# Patient Record
Sex: Female | Born: 1990 | Hispanic: Yes | Marital: Single | Smoking: Never smoker
Health system: Southern US, Community
[De-identification: ages and names within clinical notes are randomized; demographics above are authoritative.]

## PROBLEM LIST (undated history)

## (undated) HISTORY — PX: TONSILLECTOMY: SUR1361

---

## 2017-08-26 DIAGNOSIS — J309 Allergic rhinitis, unspecified: Secondary | ICD-10-CM | POA: Diagnosis not present

## 2017-08-26 DIAGNOSIS — R300 Dysuria: Secondary | ICD-10-CM | POA: Diagnosis not present

## 2017-08-26 DIAGNOSIS — N3000 Acute cystitis without hematuria: Secondary | ICD-10-CM | POA: Diagnosis not present

## 2018-02-06 DIAGNOSIS — R109 Unspecified abdominal pain: Secondary | ICD-10-CM | POA: Diagnosis not present

## 2018-02-06 DIAGNOSIS — K219 Gastro-esophageal reflux disease without esophagitis: Secondary | ICD-10-CM | POA: Diagnosis not present

## 2018-02-06 DIAGNOSIS — K5900 Constipation, unspecified: Secondary | ICD-10-CM | POA: Diagnosis not present

## 2018-05-19 DIAGNOSIS — R300 Dysuria: Secondary | ICD-10-CM | POA: Diagnosis not present

## 2018-05-19 DIAGNOSIS — N390 Urinary tract infection, site not specified: Secondary | ICD-10-CM | POA: Diagnosis not present

## 2019-01-20 ENCOUNTER — Other Ambulatory Visit: Payer: Self-pay

## 2019-01-20 DIAGNOSIS — Z20822 Contact with and (suspected) exposure to covid-19: Secondary | ICD-10-CM

## 2019-01-21 LAB — NOVEL CORONAVIRUS, NAA: SARS-CoV-2, NAA: NOT DETECTED

## 2019-01-22 ENCOUNTER — Telehealth: Payer: Self-pay | Admitting: *Deleted

## 2019-01-22 NOTE — Telephone Encounter (Signed)
Patient's husband called to translate for patient- advised negative COVID result. Patient is not having symptoms and was tested with sick children. Household is negative including children. Patient to follow up with PCP if needed.

## 2019-04-08 ENCOUNTER — Other Ambulatory Visit: Payer: Self-pay

## 2019-04-08 DIAGNOSIS — M25522 Pain in left elbow: Secondary | ICD-10-CM | POA: Diagnosis not present

## 2019-04-08 NOTE — ED Triage Notes (Signed)
Pt states she broke her left elbow years ago and today she was doing push ups when she felt sharp pain to same elbow.

## 2019-04-09 ENCOUNTER — Emergency Department: Payer: Medicaid Other

## 2019-04-09 ENCOUNTER — Emergency Department
Admission: EM | Admit: 2019-04-09 | Discharge: 2019-04-09 | Disposition: A | Discharge status: Home / Self Care | Payer: Medicaid Other | Attending: Emergency Medicine | Admitting: Emergency Medicine

## 2019-04-09 DIAGNOSIS — M25522 Pain in left elbow: Secondary | ICD-10-CM | POA: Diagnosis not present

## 2019-04-09 MED ORDER — IBUPROFEN 800 MG PO TABS
800.0000 mg | ORAL_TABLET | Freq: Once | ORAL | Status: AC
  Administered 2019-04-09: 800 mg via ORAL
  Filled 2019-04-09: qty 1

## 2019-04-09 MED ORDER — HYDROCODONE-ACETAMINOPHEN 5-325 MG PO TABS: 1.0000 | ORAL_TABLET | Freq: Four times a day (QID) | ORAL | 0 refills | Status: DC | PRN

## 2019-04-09 MED ORDER — IBUPROFEN 800 MG PO TABS: 800.0000 mg | ORAL_TABLET | Freq: Three times a day (TID) | ORAL | 0 refills | Status: DC | PRN

## 2019-04-09 MED ORDER — HYDROCODONE-ACETAMINOPHEN 5-325 MG PO TABS
1.0000 | ORAL_TABLET | Freq: Once | ORAL | Status: AC
  Administered 2019-04-09: 1 via ORAL
  Filled 2019-04-09: qty 1

## 2019-04-09 NOTE — ED Provider Notes (Signed)
[facility] Emergency Department Provider Note   ____________________________________________   First MD Initiated Contact with Patient 04/09/19 0205     (approximate)  I have reviewed the triage vital signs and the nursing notes.   HISTORY  Chief Complaint Joint Swelling    HPI Morgan Chen is a 28 y.o. female who presents to the ED from home with a chief complaint of left elbow pain.  Patient reports she broke her elbow 15 years ago; did push-ups yesterday when she felt a sharp pain to her elbow.  Patient is left-hand dominant.  Denies extremity weakness, numbness or tingling. Denies swelling.       Past medical history None  There are no active problems to display for this patient.   Prior to Admission medications   Medication Sig Start Date End Date Taking? Authorizing Provider  HYDROcodone-acetaminophen (NORCO) 5-325 MG tablet Take 1 tablet by mouth every 6 (six) hours as needed for moderate pain. 04/09/19   Irean Hong, MD  ibuprofen (ADVIL) 800 MG tablet Take 1 tablet (800 mg total) by mouth every 8 (eight) hours as needed for moderate pain. 04/09/19   Irean Hong, MD    Allergies Patient has no allergy information on record.  No family history on file.  Social History Social History   Tobacco Use  . Smoking status: Not on file  Substance Use Topics  . Alcohol use: Not on file  . Drug use: Not on file  No recent EtOH use  Review of Systems  Constitutional: No fever/chills Eyes: No visual changes. ENT: No sore throat. Cardiovascular: Denies chest pain. Respiratory: Denies shortness of breath. Gastrointestinal: No abdominal pain.  No nausea, no vomiting.  No diarrhea.  No constipation. Genitourinary: Negative for dysuria. Musculoskeletal: Positive for left elbow pain.  Negative for back pain. Skin: Negative for rash. Neurological: Negative for headaches, focal weakness or numbness.    ____________________________________________   PHYSICAL EXAM:  VITAL SIGNS: ED Triage Vitals [04/08/19 2338]  Enc Vitals Group     BP 131/79     Pulse Rate 76     Resp 20     Temp 98.6 F (37 C)     Temp Source Oral     SpO2 98 %     Weight 190 lb (86.2 kg)     Height      Head Circumference      Peak Flow      Pain Score 6     Pain Loc      Pain Edu?      Excl. in GC?     Constitutional: Alert and oriented. Well appearing and in no acute distress. Eyes: Conjunctivae are normal. PERRL. EOMI. Head: Atraumatic. Nose: No congestion/rhinnorhea. Mouth/Throat: Mucous membranes are moist.  Oropharynx non-erythematous. Neck: No stridor.   Cardiovascular: Normal rate, regular rhythm. Grossly normal heart sounds.  Good peripheral circulation. Respiratory: Normal respiratory effort.  No retractions. Lungs CTAB. Gastrointestinal: Soft and nontender. No distention. No abdominal bruits. No CVA tenderness. Musculoskeletal: Left olecranon with minimal swelling.  Limited range of motion secondary to pain.  5/5 motor strength and sensation.  2+ radial pulses.  Brisk, less than 5-second capillary refill. No lower extremity tenderness nor edema.  No joint effusions. Neurologic:  Normal speech and language. No gross focal neurologic deficits are appreciated. No gait instability. Skin:  Skin is warm, dry and intact. No rash noted. Psychiatric: Mood and affect are normal. Speech and behavior are normal.  ____________________________________________   LABS (all labs ordered are listed, but only abnormal results are displayed)  Labs Reviewed - No data to display ____________________________________________  EKG  None ____________________________________________  RADIOLOGY  ED MD interpretation: No acute osseous injuries  Official radiology report(s): Dg Elbow Complete Left  Result Date: 04/09/2019 CLINICAL DATA:  Elbow pain EXAM: LEFT ELBOW - COMPLETE 3+ VIEW COMPARISON:  None.  FINDINGS: There is no evidence of fracture, dislocation, or joint effusion. There is no evidence of arthropathy or other focal bone abnormality. Soft tissues are unremarkable. IMPRESSION: No acute osseous abnormality. Electronically Signed   By: Prudencio Pair M.D.   On: 04/09/2019 00:42    ____________________________________________   PROCEDURES  Procedure(s) performed (including Critical Care):  Procedures   ____________________________________________   INITIAL IMPRESSION / ASSESSMENT AND PLAN / ED COURSE  As part of my medical decision making, I reviewed the following data within the [place] notes reviewed and incorporated, Radiograph reviewed, Notes from prior ED visits and [place] was evaluated in Emergency Department on 04/09/2019 for the symptoms described in the history of present illness. She was evaluated in the context of the global COVID-19 pandemic, which necessitated consideration that the patient might be at risk for infection with the SARS-CoV-2 virus that causes COVID-19. Institutional protocols and algorithms that pertain to the evaluation of patients at risk for COVID-19 are in a state of rapid change based on information released by regulatory bodies including the CDC and federal and state organizations. These policies and algorithms were followed during the patient's care in the ED.    28 year old female who presents with left elbow pain after doing push-ups.  X-rays demonstrate no acute osseous injuries.  Will place in sling, NSAIDs, analgesia as needed and patient will follow-up with orthopedics.  Strict return precautions given.  Patient verbalizes understanding agrees with plan of care.      ____________________________________________   FINAL CLINICAL IMPRESSION(S) / ED DIAGNOSES  Final diagnoses:  Left elbow pain     ED Discharge Orders         Ordered    ibuprofen (ADVIL)  800 MG tablet  Every 8 hours PRN     04/09/19 0223    HYDROcodone-acetaminophen (NORCO) 5-325 MG tablet  Every 6 hours PRN     04/09/19 0223           Note:  This document was prepared using Dragon voice recognition software and may include unintentional dictation errors.   Paulette Blanch, MD 04/09/19 [phone]

## 2019-04-09 NOTE — Discharge Instructions (Signed)
1.  Wear sling as needed for comfort. 2.  You may take pain medicines as needed (Motrin/Norco #15). 3.  Return to the ER for worsening symptoms, persistent vomiting, difficulty breathing or other concerns

## 2019-10-18 ENCOUNTER — Encounter: Payer: Self-pay | Admitting: Family Medicine

## 2019-10-18 ENCOUNTER — Other Ambulatory Visit: Payer: Self-pay

## 2019-10-18 ENCOUNTER — Ambulatory Visit (LOCAL_COMMUNITY_HEALTH_CENTER): Payer: Medicaid Other | Admitting: Family Medicine

## 2019-10-18 VITALS — BP 111/71 | Ht 65.0 in | Wt 168.8 lb

## 2019-10-18 DIAGNOSIS — R87610 Atypical squamous cells of undetermined significance on cytologic smear of cervix (ASC-US): Secondary | ICD-10-CM | POA: Diagnosis not present

## 2019-10-18 DIAGNOSIS — Z3009 Encounter for other general counseling and advice on contraception: Secondary | ICD-10-CM | POA: Diagnosis not present

## 2019-10-18 DIAGNOSIS — N730 Acute parametritis and pelvic cellulitis: Secondary | ICD-10-CM

## 2019-10-18 DIAGNOSIS — Z113 Encounter for screening for infections with a predominantly sexual mode of transmission: Secondary | ICD-10-CM

## 2019-10-18 LAB — WET PREP FOR TRICH, YEAST, CLUE
Trichomonas Exam: NEGATIVE
Yeast Exam: NEGATIVE

## 2019-10-18 MED ORDER — DOXYCYCLINE HYCLATE 100 MG PO TABS: 100.0000 mg | ORAL_TABLET | Freq: Two times a day (BID) | ORAL | 0 refills | Status: AC

## 2019-10-18 MED ORDER — CEFTRIAXONE SODIUM 500 MG IJ SOLR
500.0000 mg | Freq: Once | INTRAMUSCULAR | Status: AC
  Administered 2019-10-18: 17:00:00 500 mg via INTRAMUSCULAR

## 2019-10-18 MED ORDER — MULTIVITAMINS PO CAPS: 1.0000 | ORAL_CAPSULE | Freq: Every day | ORAL | 0 refills | Status: AC

## 2019-10-18 MED ORDER — METRONIDAZOLE 500 MG PO TABS: 500.0000 mg | ORAL_TABLET | Freq: Two times a day (BID) | ORAL | 0 refills | Status: AC

## 2019-10-18 NOTE — Progress Notes (Addendum)
Here today as a new patient. No PE or Pap Smear records here. Current birth control method is Mirena IUD. Wants STD screening including bloodwork. MVI's accepted. Tawny Hopping, RN

## 2019-10-18 NOTE — Progress Notes (Signed)
Family Planning Visit- Initial Visit  Subjective:  Morgan Chen is a 29 y.o.  No obstetric history on file.  being seen today for an initial well woman visit and to discuss family planning options. Patient reports they do not want a pregnancy in the next year.   Chief Complaint  Patient presents with  . Gynecologic Exam    Pt does not have a problem list on file.   HPI  Patient reports she is here as a new pt. She would like STI screening today, she has been having abdominal pain, excessive discharge and abnormal bleeding (periods 3x/mo) for past 3 months.    No LMP recorded. (Menstrual status: IUD). BCM: mirena, placed Dec 2018 Pt desires EC? n/a  Last pap: >3 yrs  Last breast exam: ? Personal/family hx breast cancer? no  Patient reports 1 partner(s) in last year. Do they desire STI screening (if no, why not)? Yes.  Does the patient desire a pregnancy in the next year? no   29 y.o., Body mass index is 28.09 kg/m. - Is patient eligible for HA1C diabetes screening based on BMI and age >13?  no  Has patient been screened once for HCV in the past?  no  No results found for: HCVAB  Does the patient have current of drug use, have a partner with drug use, and/or has been incarcerated since last result? no If yes-- Screen for HCV through [facility]   Does the patient meet criteria for HBV testing? no  Criteria:  -Household, sexual or needle sharing contact with HBV -History of drug use -HIV positive -Those with known Hep C  See flowsheet for other program required questions.     Health Maintenance Due  Topic Date Due  . HIV Screening  Never done  . PAP-Cervical Cytology Screening  Never done  . PAP SMEAR-Modifier  Never done    Review of Systems  Constitutional: Negative.   HENT: Negative.   Eyes: Negative.   Respiratory: Negative.   Cardiovascular: Negative.   Gastrointestinal: Negative.   Genitourinary: Negative.   Musculoskeletal: Negative.    Skin: Negative.   Neurological: Negative.   Endo/Heme/Allergies: Negative.   Psychiatric/Behavioral: Negative.     The following portions of the patient's history were reviewed and updated as appropriate: allergies, current medications, past family history, past medical history, past social history, past surgical history and problem list. Problem list updated.   See flowsheet for other program required questions.  Objective:   Vitals:   10/18/19 1442  BP: 111/71  Weight: 168 lb 12.8 oz (76.6 kg)  Height: 5\' 5"  (1.651 m)    Physical Exam Vitals and nursing note reviewed.  Constitutional:      General: She is not in acute distress.    Appearance: Normal appearance. She is well-developed and well-groomed. She is not ill-appearing or diaphoretic.  HENT:     Head: Normocephalic and atraumatic.     Mouth/Throat:     Mouth: Mucous membranes are moist.     Pharynx: Oropharynx is clear. No oropharyngeal exudate or posterior oropharyngeal erythema.  Pulmonary:     Effort: Pulmonary effort is normal.  Chest:     Breasts:        Right: Normal. No swelling, bleeding, inverted nipple, mass, nipple discharge, skin change or tenderness.        Left: Normal. No swelling, bleeding, inverted nipple, mass, nipple discharge, skin change or tenderness.  Abdominal:     General: Abdomen  is flat.     Palpations: There is no mass.     Tenderness: There is no abdominal tenderness. There is no rebound.  Genitourinary:    General: Normal vulva.     Exam position: Lithotomy position.     Pubic Area: No rash or pubic lice.      Labia:        Right: No rash or lesion.        Left: No rash or lesion.      Vagina: Vaginal discharge (white, profuse, ph<4.5) present. No erythema, bleeding or lesions.     Cervix: Cervical motion tenderness and discharge present. No friability, lesion or erythema.     Uterus: Normal. Tender.      Adnexa:        Right: Tenderness present. No mass.         Left:  Tenderness present. No mass.       Rectum: Normal.     Comments: +IUD strings visualized Lymphadenopathy:     Head:     Right side of head: No preauricular or posterior auricular adenopathy.     Left side of head: No preauricular or posterior auricular adenopathy.     Cervical: No cervical adenopathy.     Upper Body:     Right upper body: No supraclavicular or axillary adenopathy.     Left upper body: No supraclavicular or axillary adenopathy.     Lower Body: No right inguinal adenopathy. No left inguinal adenopathy.  Skin:    General: Skin is warm and dry.     Findings: No rash.  Neurological:     Mental Status: She is alert and oriented to person, place, and time.       Assessment and Plan:  Morgan Chen is a 29 y.o. female presenting to the [facility] Department for an initial well woman exam/family planning visit.  Contraception counseling: Reviewed all forms of birth control options in the tiered based approach. available including abstinence; over the counter/barrier methods; hormonal contraceptive medication including pill, patch, ring, injection,contraceptive implant, ECP; hormonal and nonhormonal IUDs; permanent sterilization options including vasectomy and the various tubal sterilization modalities. Risks, benefits, and typical effectiveness rates were reviewed.  Questions were answered.  Written information was also given to the patient to review.  Patient has mirena, placed 2018, would like to keep this.  She will follow up in  1 year for surveillance.  She was told to call with any further questions, or with any concerns about this method of contraception.  Emphasized use of condoms 100% of the time for STI prevention.  Emergency Contraception: n/a - mirena in place   1. Family planning services -BCM = mirena, placed 2018 -Pap done today -CBE done today. Recommended screening mammograms beginning at age 64. - IGP, rfx Aptima HPV ASCU  2. Screening  examination for venereal disease -Pt without symptoms. Screenings today as below. Treat wet prep per standing order. -Patient does not meet criteria for HepB, HepC Screening.  -Counseled on warning s/sx and when to seek care. Recommended condom use with all sex and discussed importance of condom use for STI prevention. - WET PREP FOR [place], YEAST, [place] LAB - Syphilis Serology, [facility]  3. PID (acute pelvic inflammatory disease) -Treat for PID today.  -Pt is well appearing, able to come back in 3 days, deemed appropriate for outpatient treatment. RN to obtain pulse, temp. -Advised if  pain or fever develops to go to ER.  -Discussed need for rescreen in 3 months.  -Contact card for partner given by RN. - cefTRIAXone (ROCEPHIN) injection 500 mg - doxycycline (VIBRA-TABS) 100 MG tablet; Take 1 tablet (100 mg total) by mouth 2 (two) times daily for 14 days.  Dispense: 28 tablet; Refill: 0 - metroNIDAZOLE (FLAGYL) 500 MG tablet; Take 1 tablet (500 mg total) by mouth 2 (two) times daily for 14 days.  Dispense: 28 tablet; Refill: 0   Return in about 3 days (around 10/21/2019).  Future Appointments  Date Time Provider Department Center  10/21/2019  3:20 PM [facility] PROVIDER [facility] None    Ann Held, PA-C

## 2019-10-18 NOTE — Progress Notes (Signed)
[employer] results reviewed. Per standing orders no treatment indicated. Patient treated for PID per provider orders. Scheduled for PID recheck appt 10/21/2019 @ 3:20. Instructed to arrive at 3:00 for check in. Tawny Hopping, RN

## 2019-10-21 ENCOUNTER — Ambulatory Visit: Payer: Medicaid Other | Admitting: Family Medicine

## 2019-10-21 ENCOUNTER — Other Ambulatory Visit: Payer: Self-pay

## 2019-10-21 ENCOUNTER — Encounter: Payer: Self-pay | Admitting: Family Medicine

## 2019-10-21 VITALS — BP 107/69 | HR 57 | Temp 98.5°F | Wt 166.6 lb

## 2019-10-21 DIAGNOSIS — Z8742 Personal history of other diseases of the female genital tract: Secondary | ICD-10-CM

## 2019-10-21 DIAGNOSIS — Z113 Encounter for screening for infections with a predominantly sexual mode of transmission: Secondary | ICD-10-CM | POA: Diagnosis not present

## 2019-10-21 NOTE — Progress Notes (Signed)
Patient here for PID recheck. Burt Knack, RN

## 2019-10-21 NOTE — Progress Notes (Signed)
S: Pt states her abdominal pain has decreased since last visit. She is taking medications twice daily as directed.   O: Today's Vitals   10/21/19 1505  BP: 107/69  Pulse: (!) 57  Temp: 98.5 F (36.9 C)  Weight: 166 lb 9.6 oz (75.6 kg)   Body mass index is 27.72 kg/m.  Gen: well appearing, NAD HEENT: no scleral icterus Lung: Normal WOB Abd: nontender Pelvic: bimanual exam w/out CMT or adnexal tenderness or fullness. Ext: well perfused, no edema   A/P: Pt is much improved from prior visit 3 days ago. Advised to finish all antibiotics and RTC in 3 months for rescreen. If pt feels worse I advised to go to ER.

## 2019-10-25 LAB — HPV APTIMA: HPV Aptima: NEGATIVE

## 2019-10-25 LAB — IGP, RFX APTIMA HPV ASCU: PAP Smear Comment: 0

## 2020-01-26 DIAGNOSIS — Z20822 Contact with and (suspected) exposure to covid-19: Secondary | ICD-10-CM | POA: Diagnosis not present

## 2020-01-26 DIAGNOSIS — J069 Acute upper respiratory infection, unspecified: Secondary | ICD-10-CM | POA: Diagnosis not present

## 2020-03-27 DIAGNOSIS — B9689 Other specified bacterial agents as the cause of diseases classified elsewhere: Secondary | ICD-10-CM | POA: Diagnosis not present

## 2020-03-27 DIAGNOSIS — R590 Localized enlarged lymph nodes: Secondary | ICD-10-CM | POA: Diagnosis not present

## 2020-03-27 DIAGNOSIS — R1031 Right lower quadrant pain: Secondary | ICD-10-CM | POA: Diagnosis not present

## 2020-03-27 DIAGNOSIS — Z23 Encounter for immunization: Secondary | ICD-10-CM | POA: Diagnosis not present

## 2020-03-27 DIAGNOSIS — N760 Acute vaginitis: Secondary | ICD-10-CM | POA: Diagnosis not present

## 2020-05-26 DIAGNOSIS — Z30432 Encounter for removal of intrauterine contraceptive device: Secondary | ICD-10-CM | POA: Diagnosis not present

## 2020-08-18 ENCOUNTER — Ambulatory Visit: Payer: Medicaid Other

## 2020-08-18 ENCOUNTER — Other Ambulatory Visit: Payer: Self-pay

## 2020-08-18 ENCOUNTER — Ambulatory Visit (LOCAL_COMMUNITY_HEALTH_CENTER): Payer: Medicaid Other | Admitting: Advanced Practice Midwife

## 2020-08-18 NOTE — Progress Notes (Signed)
Per Epic, client had physical 5/3/202 at [facility] with J. Staples PA-C and PAP done at that time. On 05/26/2020, had IUD removed at [facility] since pregnancy desired. Reports 06/2020 menses and menses from 07/19/2020 - 08/13/2020. Presents to [facility] today for evaluation of 07/2020 menses, new onset of headaches / bilateral lower abdominal pain and low back pain. No vaginal bleeding at this time. Consult with Hazle Coca CNM regarding above. Per Ms. Sciora, client needs evaluation with PCP (has Medicaid). Client counseled regarding need for appt with PCP and understanding verbalized. RN verified with client that had PCP at [facility]. Jossie Ng, RN

## 2020-12-12 DIAGNOSIS — M791 Myalgia, unspecified site: Secondary | ICD-10-CM | POA: Diagnosis not present

## 2020-12-12 DIAGNOSIS — U071 COVID-19: Secondary | ICD-10-CM | POA: Diagnosis not present

## 2020-12-12 DIAGNOSIS — Z20822 Contact with and (suspected) exposure to covid-19: Secondary | ICD-10-CM | POA: Diagnosis not present

## 2020-12-12 DIAGNOSIS — J101 Influenza due to other identified influenza virus with other respiratory manifestations: Secondary | ICD-10-CM | POA: Diagnosis not present

## 2021-01-08 DIAGNOSIS — N921 Excessive and frequent menstruation with irregular cycle: Secondary | ICD-10-CM | POA: Diagnosis not present

## 2021-01-08 DIAGNOSIS — Z13220 Encounter for screening for lipoid disorders: Secondary | ICD-10-CM | POA: Diagnosis not present

## 2021-01-08 DIAGNOSIS — Z1159 Encounter for screening for other viral diseases: Secondary | ICD-10-CM | POA: Diagnosis not present

## 2021-01-09 DIAGNOSIS — Z13220 Encounter for screening for lipoid disorders: Secondary | ICD-10-CM | POA: Diagnosis not present

## 2021-01-09 DIAGNOSIS — N921 Excessive and frequent menstruation with irregular cycle: Secondary | ICD-10-CM | POA: Diagnosis not present

## 2021-01-09 DIAGNOSIS — Z1159 Encounter for screening for other viral diseases: Secondary | ICD-10-CM | POA: Diagnosis not present

## 2021-03-23 DIAGNOSIS — N979 Female infertility, unspecified: Secondary | ICD-10-CM | POA: Diagnosis not present

## 2021-04-09 DIAGNOSIS — Z0000 Encounter for general adult medical examination without abnormal findings: Secondary | ICD-10-CM | POA: Diagnosis not present

## 2021-04-09 DIAGNOSIS — Z3200 Encounter for pregnancy test, result unknown: Secondary | ICD-10-CM | POA: Diagnosis not present

## 2021-04-09 DIAGNOSIS — Z23 Encounter for immunization: Secondary | ICD-10-CM | POA: Diagnosis not present

## 2021-04-09 DIAGNOSIS — Z3201 Encounter for pregnancy test, result positive: Secondary | ICD-10-CM | POA: Diagnosis not present

## 2021-04-09 DIAGNOSIS — Z1389 Encounter for screening for other disorder: Secondary | ICD-10-CM | POA: Diagnosis not present

## 2021-04-15 ENCOUNTER — Emergency Department
Admission: EM | Admit: 2021-04-15 | Discharge: 2021-04-16 | Disposition: A | Discharge status: Home / Self Care | Payer: Medicaid Other | Attending: Emergency Medicine | Admitting: Emergency Medicine

## 2021-04-15 ENCOUNTER — Emergency Department: Payer: Medicaid Other

## 2021-04-15 DIAGNOSIS — O26851 Spotting complicating pregnancy, first trimester: Secondary | ICD-10-CM | POA: Insufficient documentation

## 2021-04-15 DIAGNOSIS — Z3A01 Less than 8 weeks gestation of pregnancy: Secondary | ICD-10-CM | POA: Diagnosis not present

## 2021-04-15 DIAGNOSIS — O4690 Antepartum hemorrhage, unspecified, unspecified trimester: Secondary | ICD-10-CM

## 2021-04-15 DIAGNOSIS — O200 Threatened abortion: Secondary | ICD-10-CM | POA: Insufficient documentation

## 2021-04-15 DIAGNOSIS — N939 Abnormal uterine and vaginal bleeding, unspecified: Secondary | ICD-10-CM | POA: Diagnosis not present

## 2021-04-15 DIAGNOSIS — O209 Hemorrhage in early pregnancy, unspecified: Secondary | ICD-10-CM | POA: Diagnosis not present

## 2021-04-15 DIAGNOSIS — R1084 Generalized abdominal pain: Secondary | ICD-10-CM | POA: Diagnosis not present

## 2021-04-15 NOTE — ED Triage Notes (Signed)
Pt here for dark red vaginal bleeding that started 2 hours ago. Pt has seen OBGYN to confirm pregnancy of 7 weeks. LMP 02/19/2021. Pt has some minor cramping in lower abdomen. Pt describes blood as "sandy". Pt in no acute distress.

## 2021-04-16 ENCOUNTER — Other Ambulatory Visit: Payer: Self-pay

## 2021-04-16 DIAGNOSIS — N939 Abnormal uterine and vaginal bleeding, unspecified: Secondary | ICD-10-CM | POA: Diagnosis not present

## 2021-04-16 DIAGNOSIS — Z3A01 Less than 8 weeks gestation of pregnancy: Secondary | ICD-10-CM | POA: Diagnosis not present

## 2021-04-16 DIAGNOSIS — O209 Hemorrhage in early pregnancy, unspecified: Secondary | ICD-10-CM | POA: Diagnosis not present

## 2021-04-16 LAB — HCG, QUANTITATIVE, PREGNANCY: hCG, Beta Chain, Quant, S: 127821 m[IU]/mL — ABNORMAL HIGH (ref <5)

## 2021-04-16 LAB — URINALYSIS, ROUTINE W REFLEX MICROSCOPIC
Bilirubin Urine: NEGATIVE
Glucose, UA: NEGATIVE mg/dL
Ketones, ur: 20 mg/dL — AB
Leukocytes,Ua: NEGATIVE
Nitrite: NEGATIVE
Protein, ur: 100 mg/dL — AB
Specific Gravity, Urine: 1.024 (ref 1.005–1.030)
pH: 5 (ref 5.0–8.0)

## 2021-04-16 LAB — CBC WITH DIFFERENTIAL/PLATELET
Abs Immature Granulocytes: 0.09 10*3/uL — ABNORMAL HIGH (ref 0.00–0.07)
Basophils Absolute: 0.1 10*3/uL (ref 0.0–0.1)
Basophils Relative: 0 %
Eosinophils Absolute: 0.4 10*3/uL (ref 0.0–0.5)
Eosinophils Relative: 3 %
HCT: 40.5 % (ref 36.0–46.0)
Hemoglobin: 13.8 g/dL (ref 12.0–15.0)
Immature Granulocytes: 1 %
Lymphocytes Relative: 35 %
Lymphs Abs: 5.1 10*3/uL — ABNORMAL HIGH (ref 0.7–4.0)
MCH: 30.7 pg (ref 26.0–34.0)
MCHC: 34.1 g/dL (ref 30.0–36.0)
MCV: 90.2 fL (ref 80.0–100.0)
Monocytes Absolute: 0.9 10*3/uL (ref 0.1–1.0)
Monocytes Relative: 6 %
Neutro Abs: 8.1 10*3/uL — ABNORMAL HIGH (ref 1.7–7.7)
Neutrophils Relative %: 55 %
Platelets: 263 10*3/uL (ref 150–400)
RBC: 4.49 MIL/uL (ref 3.87–5.11)
RDW: 12.7 % (ref 11.5–15.5)
WBC: 14.6 10*3/uL — ABNORMAL HIGH (ref 4.0–10.5)
nRBC: 0 % (ref 0.0–0.2)

## 2021-04-16 LAB — ABO/RH: ABO/RH(D): A POS

## 2021-04-16 NOTE — ED Provider Notes (Signed)
[facility] Emergency Department Provider Note  ____________________________________________   Event Date/Time   First MD Initiated Contact with Patient 04/16/21 [phone]     (approximate)  I have reviewed the triage vital signs and the nursing notes.   HISTORY  Chief Complaint Vaginal Bleeding    HPI Morgan Chen is a 30 y.o. female  who is pregnant with her third child at about [redacted] weeks gestation and presents for evaluation of acute onset vaginal bleeding.  She said it started earlier today and is mostly dark-colored blood when she goes to the bathroom but also she is needing to wear a pad.  No clots.  She is having some generalized abdominal cramping in the front and the back.  No recent trauma.  She denies fever, sore throat, chest pain, shortness of breath, nausea, vomiting.  Nothing particular makes his symptoms better or worse and they are moderate in severity.         No past medical history on file.  There are no problems to display for this patient.   No past surgical history on file.  Prior to Admission medications   Medication Sig Start Date End Date Taking? Authorizing Provider  levonorgestrel (MIRENA) 20 MCG/24HR IUD 1 each by Intrauterine route once. Placed Dec, 2018 per pt    [provider]    Allergies Patient has no known allergies.  Family History  Problem Relation Age of Onset   Diabetes Maternal Grandmother    Diabetes Maternal Aunt    Breast cancer Neg Hx     Social History Social History   Tobacco Use   Smoking status: Never   Smokeless tobacco: Never  Vaping Use   Vaping Use: Never used  Substance Use Topics   Alcohol use: Never   Drug use: Never    Review of Systems Constitutional: No fever/chills Eyes: No visual changes. ENT: No sore throat. Cardiovascular: Denies chest pain. Respiratory: Denies shortness of breath. Gastrointestinal: Mild to moderate abdominal cramping.  No nausea, no  vomiting.  No diarrhea.  No constipation. Genitourinary: Positive for vaginal bleeding in early pregnancy Musculoskeletal: Negative for neck pain.  Mild low back pain Integumentary: Negative for rash. Neurological: Negative for headaches, focal weakness or numbness.   ____________________________________________   PHYSICAL EXAM:  VITAL SIGNS: ED Triage Vitals  Enc Vitals Group     BP 04/15/21 2359 121/69     Pulse Rate 04/15/21 2359 68     Resp 04/15/21 2359 16     Temp 04/15/21 2359 98.6 F (37 C)     Temp Source 04/15/21 2359 Oral     SpO2 04/15/21 2359 100 %     Weight 04/16/21 0000 87.1 kg (192 lb)     Height 04/16/21 0000 1.651 m (5\' 5" )     Head Circumference --      Peak Flow --      Pain Score 04/16/21 0000 5     Pain Loc --      Pain Edu? --      Excl. in GC? --     Constitutional: Alert and oriented.  Eyes: Conjunctivae are normal.  Head: Atraumatic. Nose: No congestion/rhinnorhea. Mouth/Throat: Patient is wearing a mask. Neck: No stridor.  No meningeal signs.   Cardiovascular: Normal rate, regular rhythm. Good peripheral circulation. Respiratory: Normal respiratory effort.  No retractions. Gastrointestinal: Soft and nondistended.  Diffuse tenderness palpation throughout the abdomen with no rebound or guarding. Genitourinary: Deferred Musculoskeletal: No lower extremity  tenderness nor edema. No gross deformities of extremities. Neurologic:  Normal speech and language. No gross focal neurologic deficits are appreciated.  Skin:  Skin is warm, dry and intact. Psychiatric: Mood and affect are normal. Speech and behavior are normal.  ____________________________________________   LABS (all labs ordered are listed, but only abnormal results are displayed)  Labs Reviewed  HCG, QUANTITATIVE, PREGNANCY - Abnormal; Notable for the following components:      Result Value   hCG, Beta Chain, [place] Longest 086,578 (*)    All other components within normal limits  CBC  WITH DIFFERENTIAL/PLATELET - Abnormal; Notable for the following components:   WBC 14.6 (*)    Neutro Abs 8.1 (*)    Lymphs Abs 5.1 (*)    Abs Immature Granulocytes 0.09 (*)    All other components within normal limits  URINALYSIS, ROUTINE W REFLEX MICROSCOPIC - Abnormal; Notable for the following components:   Color, Urine YELLOW (*)    APPearance CLOUDY (*)    Hgb urine dipstick LARGE (*)    Ketones, ur 20 (*)    Protein, ur 100 (*)    All other components within normal limits  URINE CULTURE  ABO/RH   ____________________________________________   RADIOLOGY Marylou Mccoy, personally viewed and evaluated these images (plain radiographs) as part of my medical decision making, as well as reviewing the written report by the radiologist.  ED MD interpretation: 6 weeks and 5 days single intrauterine gestation.  No acute abnormalities noted on ultrasound.  Official radiology report(s): US OB Comp Less 14 Wks  Result Date: 04/16/2021 CLINICAL DATA:  Pregnant, LMP 02/21/2021, vaginal bleeding EXAM: OBSTETRIC <14 WK ULTRASOUND TECHNIQUE: Transabdominal ultrasound was performed for evaluation of the gestation as well as the maternal uterus and adnexal regions. COMPARISON:  None. FINDINGS: Intrauterine gestational sac: Present, single Yolk sac:  Present, single, normal appearing Embryo:  Present, single Cardiac Activity: Present Heart Rate: 136 bpm CRL:   8 mm   6 w 5 d                  [place] EDC: 12/04/2020 Subchorionic hemorrhage:  None visualized. Maternal uterus/adnexae: The uterus is anteverted. No intrauterine masses are seen. The cervix is closed and is unremarkable. No free fluid within the cul-de-sac. The maternal ovaries are unremarkable. IMPRESSION: Single living intrauterine gestation with an estimated gestational age of [redacted] weeks, 5 days. Electronically Signed   By: Helyn Numbers M.D.   On: 04/16/2021 00:46     ____________________________________________   PROCEDURES   Procedure(s) performed (including Critical Care):  Procedures   ____________________________________________   INITIAL IMPRESSION / MDM / ASSESSMENT AND PLAN / ED COURSE  As part of my medical decision making, I reviewed the following data within the electronic MEDICAL RECORD NUMBER Nursing notes reviewed and incorporated, Labs reviewed , Old chart reviewed, and Notes from prior ED visits   Differential diagnosis includes, but is not limited to, threatened miscarriage, incomplete miscarriage, normal bleeding from an early trimester pregnancy, ectopic pregnancy, , blighted ovum, vaginal/cervical trauma, subchorionic hemorrhage/hematoma, etc.  Vital signs stable throughout extended period of time in the emergency department.  Physical exam reassuring.  Cloudy urine but no indication of infection, ordered urine culture to verify.  CBC is essentially normal; mild leukocytosis but normal hemoglobin.  ABO/Rh is A-positive, no need for RhoGAM.  Ultrasound shows single viable intrauterine pregnancy at about estimated age of approximately 7 weeks (6 weeks and 5 days).  I gave the patient my usual  and customary threatened miscarriage discussion and she will call for follow-up at her OB/GYN clinic.  I gave my usual and customary return precautions.           ____________________________________________  FINAL CLINICAL IMPRESSION(S) / ED DIAGNOSES  Final diagnoses:  Vaginal bleeding in pregnancy  Threatened miscarriage     MEDICATIONS GIVEN DURING THIS VISIT:  Medications - No data to display   ED Discharge Orders     None        Note:  This document was prepared using Dragon voice recognition software and may include unintentional dictation errors.   Loleta Rose, MD 04/16/21 [phone]

## 2021-04-17 DIAGNOSIS — Z3A01 Less than 8 weeks gestation of pregnancy: Secondary | ICD-10-CM | POA: Diagnosis not present

## 2021-04-17 DIAGNOSIS — O209 Hemorrhage in early pregnancy, unspecified: Secondary | ICD-10-CM | POA: Diagnosis not present

## 2021-04-17 LAB — URINE CULTURE

## 2021-04-23 DIAGNOSIS — Z0000 Encounter for general adult medical examination without abnormal findings: Secondary | ICD-10-CM | POA: Diagnosis not present

## 2021-04-23 DIAGNOSIS — O200 Threatened abortion: Secondary | ICD-10-CM | POA: Diagnosis not present

## 2021-06-19 ENCOUNTER — Other Ambulatory Visit: Payer: Self-pay | Admitting: Physician Assistant

## 2021-06-19 DIAGNOSIS — Z3482 Encounter for supervision of other normal pregnancy, second trimester: Secondary | ICD-10-CM

## 2021-06-20 ENCOUNTER — Ambulatory Visit
Admission: RE | Admit: 2021-06-20 | Discharge: 2021-06-20 | Disposition: A | Discharge status: Home / Self Care | Payer: Medicaid Other | Source: Ambulatory Visit | Attending: Physician Assistant | Admitting: Physician Assistant

## 2021-06-20 ENCOUNTER — Other Ambulatory Visit: Payer: Self-pay

## 2021-06-20 DIAGNOSIS — Z3482 Encounter for supervision of other normal pregnancy, second trimester: Secondary | ICD-10-CM | POA: Insufficient documentation

## 2021-09-08 ENCOUNTER — Encounter: Payer: Self-pay | Admitting: Emergency Medicine

## 2021-09-08 ENCOUNTER — Other Ambulatory Visit: Payer: Self-pay

## 2021-09-08 ENCOUNTER — Emergency Department
Admission: EM | Admit: 2021-09-08 | Discharge: 2021-09-08 | Disposition: A | Discharge status: Home / Self Care | Payer: Medicaid Other | Attending: Emergency Medicine | Admitting: Emergency Medicine

## 2021-09-08 DIAGNOSIS — J069 Acute upper respiratory infection, unspecified: Secondary | ICD-10-CM | POA: Diagnosis not present

## 2021-09-08 DIAGNOSIS — O26892 Other specified pregnancy related conditions, second trimester: Secondary | ICD-10-CM | POA: Diagnosis present

## 2021-09-08 DIAGNOSIS — Z3A24 24 weeks gestation of pregnancy: Secondary | ICD-10-CM | POA: Diagnosis not present

## 2021-09-08 DIAGNOSIS — Z20822 Contact with and (suspected) exposure to covid-19: Secondary | ICD-10-CM | POA: Diagnosis not present

## 2021-09-08 DIAGNOSIS — O99512 Diseases of the respiratory system complicating pregnancy, second trimester: Secondary | ICD-10-CM | POA: Diagnosis not present

## 2021-09-08 LAB — RESP PANEL BY RT-PCR (FLU A&B, COVID) ARPGX2
Influenza A by PCR: NEGATIVE
Influenza B by PCR: NEGATIVE
SARS Coronavirus 2 by RT PCR: NEGATIVE

## 2021-09-08 MED ORDER — ACETAMINOPHEN 325 MG PO TABS
650.0000 mg | ORAL_TABLET | Freq: Once | ORAL | Status: AC
  Administered 2021-09-08: 650 mg via ORAL
  Filled 2021-09-08: qty 2

## 2021-09-08 NOTE — Discharge Instructions (Signed)
Follow-up with your doctor if any continued problems.  Saline nose spray for congestion. ?Tylenol if needed for fever ?Drink lots of fluids to stay hydrated ? ?

## 2021-09-08 NOTE — ED Notes (Signed)
Pt to ED for fever and sore throat, HA, cough and generalized body aches at [redacted] weeks pregnant approx. FHTs were auscultated in triage. Lungs clear but pt has productive cough complains of nasal and chest congestion. ?

## 2021-09-08 NOTE — ED Triage Notes (Signed)
Pt via POV from home. Pt c/o cough, fever, and generalized body aches since yesterday. Pt has been cough up phlegm. Pt is about [redacted] weeks pregnant. Pt took tylenol last night. Pt is A&OX4 and NAD.  ? ?FHT 170bpm ?

## 2021-09-08 NOTE — ED Provider Notes (Signed)
? ?[facility] ?Provider Note ? ? ? Event Date/Time  ? First MD Initiated Contact with Patient 09/08/21 1103   ?  (approximate) ? ? ?History  ? ?Fever, Generalized Body Aches, and Cough ? ? ?HPI ? ?Morgan Chen is a 31 y.o. female presents to the ED with complaint of cough, congestion and fever since yesterday.  Patient denies any nausea, vomiting or diarrhea.  No else in the family is sick at this time.  Patient currently is [redacted] weeks pregnant and goes to [facility] for her prenatal care. ?  ? ? ?Physical Exam  ? ?Triage Vital Signs: ?ED Triage Vitals  ?Enc Vitals Group  ?   BP 09/08/21 1052 112/72  ?   Pulse Rate 09/08/21 1052 (!) 123  ?   Resp 09/08/21 1052 20  ?   Temp 09/08/21 1052 (!) 100.9 ?F (38.3 ?C)  ?   Temp Source 09/08/21 1052 Oral  ?   SpO2 09/08/21 1052 95 %  ?   Weight 09/08/21 1056 198 lb (89.8 kg)  ?   Height 09/08/21 1054 5\' 5"  (1.651 m)  ?   Head Circumference --   ?   Peak Flow --   ?   Pain Score 09/08/21 1053 10  ?   Pain Loc --   ?   Pain Edu? --   ?   Excl. in [place]? --   ? ? ?Most recent vital signs: ?Vitals:  ? 09/08/21 1052 09/08/21 1316  ?BP: 112/72 112/70  ?Pulse: (!) 123 95  ?Resp: 20 18  ?Temp: (!) 100.9 ?F (38.3 ?C) 98.6 ?F (37 ?C)  ?SpO2: 95% 96%  ? ? ? ?General: Awake, no distress.  ?CV:  Good peripheral perfusion.  Regular rate and rhythm. ?Resp:  Normal effort.  Clear bilaterally. ?Abd:  No distention.  ?Other:  Nasal congestion present. ? ? ?ED Results / Procedures / Treatments  ? ?Labs ?(all labs ordered are listed, but only abnormal results are displayed) ?Labs Reviewed  ?RESP PANEL BY RT-PCR (FLU A&B, COVID) [id number]  ? ? ? ? ?PROCEDURES: ? ?Critical Care performed:  ? ?Procedures ? ? ?MEDICATIONS ORDERED IN ED: ?Medications  ?acetaminophen (TYLENOL) tablet 650 mg (650 mg Oral Given 09/08/21 1059)  ? ? ? ?IMPRESSION / MDM / ASSESSMENT AND PLAN / ED COURSE  ?I reviewed the triage vital signs and the nursing notes. ? ? ?Differential  diagnosis includes, but is not limited to, influenza, COVID, viral upper respiratory infection. ? ?Spanish interpreter per [place] ? ?31 year old female presents to the ED with complaint of nasal congestion, fever and generalized body aches since yesterday.  Patient is approximately [redacted] weeks pregnant and denies any pregnancy complications.  She has had no exposure and no one else in the family is sick.  She was reassured that her COVID and influenza test were negative.  We discussed use and saline nose spray for nasal congestion and increase fluids to stay hydrated.  She is aware that she can only take Tylenol and not NSAIDs while she is pregnant.  If she continues to have any issues she should follow-up with her OB/GYN at [facility]. ? ? ?FINAL CLINICAL IMPRESSION(S) / ED DIAGNOSES  ? ?Final diagnoses:  ?Viral URI with cough  ? ? ? ?Rx / DC Orders  ? ?ED Discharge Orders   ? ? None  ? ?  ? ? ? ?Note:  This document was prepared using Systems analyst and  may include unintentional dictation errors. ?  ?Johnn Hai, PA-C ?09/08/21 1426 ? ?  ?Delman Kitten, MD ?09/08/21 1551 ? ?

## 2022-01-04 ENCOUNTER — Emergency Department
Admission: EM | Admit: 2022-01-04 | Discharge: 2022-01-04 | Disposition: A | Discharge status: Home / Self Care | Payer: Medicaid Other | Attending: Student in an Organized Health Care Education/Training Program | Admitting: Student in an Organized Health Care Education/Training Program

## 2022-01-04 ENCOUNTER — Emergency Department: Payer: Medicaid Other

## 2022-01-04 ENCOUNTER — Other Ambulatory Visit: Payer: Self-pay

## 2022-01-04 DIAGNOSIS — R6883 Chills (without fever): Secondary | ICD-10-CM | POA: Insufficient documentation

## 2022-01-04 DIAGNOSIS — R0789 Other chest pain: Secondary | ICD-10-CM | POA: Diagnosis not present

## 2022-01-04 DIAGNOSIS — R519 Headache, unspecified: Secondary | ICD-10-CM | POA: Diagnosis not present

## 2022-01-04 DIAGNOSIS — R1011 Right upper quadrant pain: Secondary | ICD-10-CM | POA: Diagnosis not present

## 2022-01-04 DIAGNOSIS — D72829 Elevated white blood cell count, unspecified: Secondary | ICD-10-CM | POA: Insufficient documentation

## 2022-01-04 LAB — BASIC METABOLIC PANEL
Anion gap: 8 (ref 5–15)
BUN: 13 mg/dL (ref 6–20)
CO2: 23 mmol/L (ref 22–32)
Calcium: 9.3 mg/dL (ref 8.9–10.3)
Chloride: 104 mmol/L (ref 98–111)
Creatinine, Ser: 0.85 mg/dL (ref 0.44–1.00)
GFR, Estimated: >60 mL/min (ref >60)
Glucose, Bld: 142 mg/dL — ABNORMAL HIGH (ref 70–99)
Potassium: 4.2 mmol/L (ref 3.5–5.1)
Sodium: 135 mmol/L (ref 135–145)

## 2022-01-04 LAB — HEPATIC FUNCTION PANEL
ALT: 22 U/L (ref 0–44)
AST: 29 U/L (ref 15–41)
Albumin: 3.9 g/dL (ref 3.5–5.0)
Alkaline Phosphatase: 95 U/L (ref 38–126)
Bilirubin, Direct: 0.3 mg/dL — ABNORMAL HIGH (ref 0.0–0.2)
Indirect Bilirubin: 0.7 mg/dL (ref 0.3–0.9)
Total Bilirubin: 1 mg/dL (ref 0.3–1.2)
Total Protein: 7.2 g/dL (ref 6.5–8.1)

## 2022-01-04 LAB — URINALYSIS, ROUTINE W REFLEX MICROSCOPIC
Bilirubin Urine: NEGATIVE
Glucose, UA: NEGATIVE mg/dL
Hgb urine dipstick: NEGATIVE
Ketones, ur: NEGATIVE mg/dL
Leukocytes,Ua: NEGATIVE
Nitrite: NEGATIVE
Protein, ur: NEGATIVE mg/dL
Specific Gravity, Urine: 1.011 (ref 1.005–1.030)
pH: 5 (ref 5.0–8.0)

## 2022-01-04 LAB — CBC
HCT: 40.1 % (ref 36.0–46.0)
Hemoglobin: 13.2 g/dL (ref 12.0–15.0)
MCH: 29.2 pg (ref 26.0–34.0)
MCHC: 32.9 g/dL (ref 30.0–36.0)
MCV: 88.7 fL (ref 80.0–100.0)
Platelets: 343 10*3/uL (ref 150–400)
RBC: 4.52 MIL/uL (ref 3.87–5.11)
RDW: 12.9 % (ref 11.5–15.5)
WBC: 12.4 10*3/uL — ABNORMAL HIGH (ref 4.0–10.5)
nRBC: 0 % (ref 0.0–0.2)

## 2022-01-04 LAB — LIPASE, BLOOD: Lipase: 36 U/L (ref 11–51)

## 2022-01-04 LAB — POC URINE PREG, ED: Preg Test, Ur: NEGATIVE

## 2022-01-04 LAB — TROPONIN I (HIGH SENSITIVITY): Troponin I (High Sensitivity): 3 ng/L (ref <18)

## 2022-01-04 MED ORDER — KETOROLAC TROMETHAMINE 30 MG/ML IJ SOLN
30.0000 mg | Freq: Once | INTRAMUSCULAR | Status: AC
  Administered 2022-01-04: 30 mg via INTRAMUSCULAR
  Filled 2022-01-04: qty 1

## 2022-01-04 NOTE — ED Triage Notes (Signed)
Patient c/o chest pain midsternum to right side radiating to shoulder since yesterday. Today c.o headache and blurry vision. Denies N/VD

## 2022-01-04 NOTE — ED Provider Notes (Signed)
[facility] Provider Note    Event Date/Time   First MD Initiated Contact with Patient 01/04/22 2151     (approximate)   History   Chest Pain   HPI  Morgan Chen is a 31 y.o. female presents to the ER for evaluation of 1 day of right-sided chest pain and right upper quadrant discomfort has had some headache and chills.  Never had pain like this before.  No sick contacts at home no dysuria no vaginal discharge no diarrhea.  No history of blood clots not on birth control.     Physical Exam   Triage Vital Signs: ED Triage Vitals  Enc Vitals Group     BP 01/04/22 1746 113/69     Pulse Rate 01/04/22 1746 91     Resp 01/04/22 1746 18     Temp 01/04/22 1746 99.1 F (37.3 C)     Temp Source 01/04/22 1746 Oral     SpO2 01/04/22 1746 97 %     Weight --      Height --      Head Circumference --      Peak Flow --      Pain Score 01/04/22 1752 9     Pain Loc --      Pain Edu? --      Excl. in [place]? --     Most recent vital signs: Vitals:   01/04/22 2153 01/04/22 2351  BP: 98/64 130/88  Pulse: 90 90  Resp: 17 16  Temp: 98.4 F (36.9 C)   SpO2: 95% 96%     Constitutional: Alert  Eyes: Conjunctivae are normal.  Head: Atraumatic. Nose: No congestion/rhinnorhea. Mouth/Throat: Mucous membranes are moist.   Neck: Painless ROM.  Cardiovascular:   Good peripheral circulation. Respiratory: Normal respiratory effort.  No retractions.  Gastrointestinal: Soft with mild ttp in ruq Musculoskeletal:  no deformity Neurologic:  MAE spontaneously. No gross focal neurologic deficits are appreciated.  Skin:  Skin is warm, dry and intact. No rash noted. Psychiatric: Mood and affect are normal. Speech and behavior are normal.    ED Results / Procedures / Treatments   Labs (all labs ordered are listed, but only abnormal results are displayed) Labs Reviewed  BASIC METABOLIC PANEL - Abnormal; Notable for the following components:      Result Value    Glucose, Bld 142 (*)    All other components within normal limits  CBC - Abnormal; Notable for the following components:   WBC 12.4 (*)    All other components within normal limits  HEPATIC FUNCTION PANEL - Abnormal; Notable for the following components:   Bilirubin, Direct 0.3 (*)    All other components within normal limits  URINALYSIS, ROUTINE W REFLEX MICROSCOPIC - Abnormal; Notable for the following components:   Color, Urine STRAW (*)    APPearance CLEAR (*)    All other components within normal limits  LIPASE, BLOOD  POC URINE PREG, ED  TROPONIN I (HIGH SENSITIVITY)     EKG  ED ECG REPORT I, Willy Eddy, the attending physician, personally viewed and interpreted this ECG.   Date: 01/04/2022  EKG Time: 17:51  Rate: 90  Rhythm: sinus  Axis: normal  Intervals:normal  ST&T Change: no stemi, no depressions    RADIOLOGY Please see ED Course for my review and interpretation.  I personally reviewed all radiographic images ordered to evaluate for the above acute complaints and reviewed radiology reports and findings.  These findings  were personally discussed with the patient.  Please see medical record for radiology report.    PROCEDURES:  Critical Care performed:   Procedures   MEDICATIONS ORDERED IN ED: Medications  ketorolac (TORADOL) 30 MG/ML injection 30 mg (30 mg Intramuscular Given 01/04/22 2211)     IMPRESSION / MDM / ASSESSMENT AND PLAN / ED COURSE  I reviewed the triage vital signs and the nursing notes.                              Differential diagnosis includes, but is not limited to, pleurisy, costochondritis, pneumonia, pneumothorax, ACS, biliary pathology, pancreatitis  Patient presented to the ER for evaluation of symptoms as described above.  She nontoxic-appearing in no acute distress.  Is a mild leukocytosis does have mild right upper quadrant abdominal pain.  EKG is nonischemic her troponin is negative.  This not consistent with  cardiac etiology.  She is low risk by Wells criteria and is PERC negative its not consistent with PE.  She not meningitic does not appear septic.  Will provide analgesic check ultrasound and reassess   Clinical Course as of 01/04/22 2354  Fri Jan 04, 2022  2346 Patient reassessed.  She is pain-free.  Tolerating p.o. afebrile she feels well.  Do not believe her symptoms are cardiac in nature.    Will give referral to outpatient work-up given cholelithiasis.  We did discuss strict return precautions.  Patient agreeable to plan. [PR]    Clinical Course User Index [PR] Willy Eddy, MD    FINAL CLINICAL IMPRESSION(S) / ED DIAGNOSES   Final diagnoses:  Atypical chest pain  RUQ abdominal pain     Rx / DC Orders   ED Discharge Orders     None        Note:  This document was prepared using Dragon voice recognition software and may include unintentional dictation errors.    Willy Eddy, MD 01/04/22 [phone]

## 2022-01-15 DIAGNOSIS — K8050 Calculus of bile duct without cholangitis or cholecystitis without obstruction: Secondary | ICD-10-CM

## 2022-01-15 HISTORY — DX: Calculus of bile duct without cholangitis or cholecystitis without obstruction: K80.50

## 2022-01-16 ENCOUNTER — Encounter: Payer: Self-pay | Admitting: Surgery

## 2022-01-16 ENCOUNTER — Telehealth: Payer: Self-pay | Admitting: Surgery

## 2022-01-16 ENCOUNTER — Ambulatory Visit: Payer: Medicaid Other | Admitting: Surgery

## 2022-01-16 VITALS — BP 112/77 | HR 73 | Temp 98.1°F | Ht 64.0 in | Wt 204.8 lb

## 2022-01-16 DIAGNOSIS — K8020 Calculus of gallbladder without cholecystitis without obstruction: Secondary | ICD-10-CM | POA: Diagnosis not present

## 2022-01-16 NOTE — Telephone Encounter (Signed)
Spoke with husband, Lars Mage and patient and they have been advised of Pre-Admission date/time, and Surgery date.  Surgery Date: 01/31/22 Preadmission Testing Date: 01/25/22 (phone 1p-5p)  Patient has been made aware to call [phone], between 1-3:00pm the day before surgery, to find out what time to arrive for surgery.

## 2022-01-16 NOTE — Patient Instructions (Signed)
Our surgery scheduler Britta Mccreedy will call you within 24-48 hours to get you scheduled. If you have not heard from her after 48 hours, please call our office. Have the blue sheet available when she calls to write down important information.  If you have any concerns or questions, please feel free to call our office.  Colecistectoma mnimamente invasiva Minimally Invasive Cholecystectomy Una colecistectoma mnimamente invasiva es una ciruga que se realiza para extirpar la vescula biliar. La vescula biliar es un rgano que tiene forma de pera y se encuentra debajo del hgado, del lado derecho del cuerpo. La vescula biliar almacena bilis, un lquido que ayuda al organismo a digerir las grasas. La colecistectoma se realiza con frecuencia para tratar la inflamacin (irritacin e hinchazn) de la vescula biliar (colecistitis). Por lo general, esta afeccin se debe a una acumulacin de clculos biliares (colelitiasis) en la vescula biliar o al estancamiento del lquido de la vescula biliar a causa de que los clculos biliares se atascan en los conductos (tubos) y obstruyen el paso de la bilis. Esto puede producir inflamacin y Engineer, mining. En los [employer], podr ser [place]. Este procedimiento se realiza a travs de pequeas incisiones en el abdomen, en lugar de una incisin grande. Tambin se denomina "ciruga laparoscpica". Se introduce un endoscopio delgado que tiene Secretary/administrator (laparoscopio) a travs de una incisin. A travs de las otras incisiones, se introducen los instrumentos quirrgicos. En algunos casos, es posible que [place] mnimamente invasiva deba cambiarse a una [place] realizada a travs de una incisin ms grande. Esta se denomina "ciruga abierta". Informe al mdico acerca de lo siguiente: Cualquier alergia que tenga. Todos los [employer] [place], incluidos vitaminas, hierbas, gotas oftlmicas, cremas y [address] de [address]. Problemas previos que  usted o algn miembro de su familia hayan tenido con los anestsicos. Cualquier problema de la sangre que tenga. Cirugas a las que se haya sometido. Cualquier afeccin mdica que tenga. Si est embarazada o podra estarlo. Cules son los riesgos? En general, se trata de un procedimiento seguro. Sin embargo, pueden ocurrir complicaciones, por ejemplo: Infeccin. Sangrado. Reacciones alrgicas a los medicamentos. Daos a las estructuras o los rganos cercanos. Un clculo biliar que queda en el conducto biliar comn. El conducto coldoco transporta la bilis desde la vescula biliar hasta el intestino delgado. Una filtracin de bilis del hgado o del conducto qustico despus de que se extirpa la vescula biliar. Qu ocurre antes del procedimiento? Cundo dejar de comer y beber Siga las instrucciones del mdico con respecto a lo que puede comer y beber antes del procedimiento. Pueden incluir: Ocho horas antes del procedimiento Deje de comer la mayora de los alimentos. No coma carne, alimentos fritos ni alimentos grasos. Consuma solo alimentos livianos, como tostadas o Social worker. Todos los lquidos son aceptables, excepto las bebidas energticas y el alcohol. Seis horas antes del procedimiento Deje de comer. Beba nicamente lquidos transparentes, como agua, jugo de fruta transparente, caf solo, t solo y bebidas deportivas. No consuma bebidas energticas ni alcohol. Dos horas antes del procedimiento Deje de beber todos los lquidos. Es posible que le permitan tomar medicamentos con pequeos sorbos de [place]. Si no sigue las instrucciones del mdico, el procedimiento puede retrasarse o cancelarse. Medicamentos Consulte al mdico si debe hacer o no lo siguiente: Multimedia programmer o suspender los medicamentos que [place] habitualmente. Esto es muy importante si toma medicamentos para la diabetes o anticoagulantes. Tomar medicamentos como aspirina e ibuprofeno. Estos medicamentos pueden tener un  efecto anticoagulante en la sangre. No tome estos medicamentos a menos que el mdico se lo indique. Usar medicamentos de venta libre, vitaminas, hierbas y suplementos. Instrucciones generales Si va a marcharse a su casa inmediatamente despus del procedimiento, pdale a un adulto responsable que: Lo lleve a su casa desde el hospital o la clnica. No se le permitir conducir. Lo cuide durante el [employer] indiquen. No consuma ningn producto que contenga nicotina ni tabaco durante al [employer] las 4 semanas anteriores al procedimiento. Estos productos incluyen cigarrillos, tabaco para Theatre manager y aparatos de vapeo, como los Administrator, Civil Service. Si necesita ayuda para dejar de fumar, consulte al [employer]. Pregntele al mdico: Cmo se Forensic psychologist de la Leisure centre manager. Qu medidas se tomarn para evitar una infeccin. Pueden incluir: Rasurar el vello del lugar de la ciruga. Lavar la piel con un jabn antisptico. Recibir antibiticos. Qu ocurre durante el procedimiento?  Le colocarn una va intravenosa (i.v.) en una vena. Le administrarn uno de los siguientes medicamentos o ambos: Un medicamento para ayudar a Lexicographer (sedante). Un medicamento que lo har dormir (anestesia general). Su cirujano le har varias incisiones pequeas en el abdomen. El laparoscopio se introducir a travs de una de las pequeas incisiones. La cmara del laparoscopio enviar imgenes a un monitor que se encuentra en el quirfano. Esto permitir a su Pensions consultant del abdomen. Le inyectarn un gas en el abdomen. Esto expandir el abdomen para que el cirujano tenga ms lugar para Facilities manager. El resto del instrumental necesario para el procedimiento se introducir a travs de las otras incisiones. Se extirpar la vescula biliar a travs de una de las incisiones. Se puede examinar el conducto coldoco. Si se encuentran clculos en la va biliar, tal vez deban extirparse. Despus de la extirpacin de la  vescula biliar, se cerrarn las incisiones con puntos (suturas), grapas o goma para cerrar la piel. Las incisiones pueden cubrirse con una venda (vendaje). El procedimiento puede variar segn el mdico y el hospital. Ladell Heads ocurre despus del procedimiento? Le controlarn la presin arterial, la frecuencia cardaca, la frecuencia respiratoria y Air cabin crew de oxgeno en la sangre hasta que le den el alta del hospital o la clnica. Le darn analgsicos para Human resources officer, si es necesario. Es posible que le coloquen un drenaje en la incisin. Se lo retirarn [employer] despus del procedimiento. Resumen La colecistectoma mnimamente invasiva, tambin llamada colecistectoma laparoscpica, es una ciruga que se realiza para extirpar la vescula biliar a travs de pequeas incisiones. Informe a su mdico sobre todas las otras afecciones que tenga y [place] todos los medicamentos que est usando para dichas afecciones. Antes del procedimiento, siga las instrucciones sobre cundo dejar de comer y beber y Tacy Dura cambiar o suspender medicamentos. Haga que un adulto responsable lo cuide durante el tiempo que le indiquen despus de que le den el alta del hospital o de la clnica. Esta informacin no tiene Theme park manager el consejo del mdico. Asegrese de hacerle al mdico cualquier pregunta que tenga. Document Revised: 12/20/2020 Document Reviewed: 12/20/2020 Elsevier Patient Education  2023 [employer].

## 2022-01-18 NOTE — Progress Notes (Signed)
Patient ID: Morgan Chen, female   DOB: 08-Jan-1991, 31 y.o.   MRN: [id number]  HPI Morgan Chen is a 31 y.o. female   HPI  History reviewed. No pertinent past medical history.  Seen in consultation at the request of Dr. Roxan Hockey for biliary colic versus chronic cholecystitis.  She reports few week history approximately 3-4 of right-sided abdominal pain.  Also reports some epigastric pain.  Pain is sharp moderate intensity worsening with certain meals.  She did came to the emergency room couple weeks ago due to the severity of the pain.  She denies any fevers or chills.  No evidence of bleeding or obstruction.  She did have appropriate work-up in the ER and ultrasound of the right upper quadrant that I have personally reviewed showing evidence of cholelithiasis normal common bile duct.  CBC and CMP were completely normal She is able to perform more than 4 METS of activity without any shortness of breath or chest pain.  She did have a recent C-section 32 months ago.  She has recovered well    Past Surgical History:  Procedure Laterality Date   CESAREAN SECTION  11/13/2021    Family History  Problem Relation Age of Onset   Diabetes Maternal Grandmother    Diabetes Maternal Aunt    Breast cancer Neg Hx     Social History Social History   Tobacco Use   Smoking status: Never   Smokeless tobacco: Never  Vaping Use   Vaping Use: Never used  Substance Use Topics   Alcohol use: Never   Drug use: Never    No Known Allergies  No current outpatient medications on file.   No current facility-administered medications for this visit.     Review of Systems Full ROS  was asked and was negative except for the information on the HPI  Physical Exam Blood pressure 112/77, pulse 73, temperature 98.1 F (36.7 C), temperature source Oral, height 5\' 4"  (1.626 m), weight 204 lb 12.8 oz (92.9 kg), SpO2 98 %. CONSTITUTIONAL: NAD. EYES: Pupils are equal, round, , Sclera are  non-icteric. EARS, NOSE, MOUTH AND THROAT: The oral mucosa is pink and moist. Hearing is intact to voice. LYMPH NODES:  Lymph nodes in the neck are normal. RESPIRATORY:  Lungs are clear. There is normal respiratory effort, with equal breath sounds bilaterally, and without pathologic use of accessory muscles. CARDIOVASCULAR: Heart is regular without murmurs, gallops, or rubs. GI: The abdomen is  soft, nontender, and nondistended. There are no palpable masses. There is no hepatosplenomegaly. There are normal bowel sounds.  C-section scar there is no deep infection GU: Rectal deferred.   MUSCULOSKELETAL: Normal muscle strength and tone. No cyanosis or edema.   SKIN: Turgor is good and there are no pathologic skin lesions or ulcers. NEUROLOGIC: Motor and sensation is grossly normal. Cranial nerves are grossly intact. PSYCH:  Oriented to person, place and time. Affect is normal.  Data Reviewed  I have personally reviewed the patient's imaging, laboratory findings and medical records.    Assessment/Plan   31 year old female with biliary colic/chronic cholecystitis. I Discussed with the patient in detail about her disease process.  I do definitely recommend cholecystectomy in the timely fashion.  I do think she will be a good candidate for robotic approach. I discussed the procedure in detail.  The patient was given 38.  We discussed the risks and benefits of a laparoscopic cholecystectomy and possible cholangiogram including, but not limited to bleeding, infection,  injury to surrounding structures such as the intestine or liver, bile leak, retained gallstones, need to convert to an open procedure, prolonged diarrhea, blood clots such as  DVT, common bile duct injury, anesthesia risks, and possible need for additional procedures.  The likelihood of improvement in symptoms and return to the patient's normal status is good. We discussed the typical post-operative recovery course.  I spent  greater than 60 minutes in this encounter including personally reviewing imaging studies, coordinating her care, placing orders counseling the patient and performing appropriate documentation  [name], MD FACS General Surgeon 01/18/2022, 2:56 PM    

## 2022-01-18 NOTE — H&P (View-Only) (Signed)
Patient ID: Morgan Chen, female   DOB: 08-Jan-1991, 32 y.o.   MRN: [id number]  HPI Morgan Chen is a 30 y.o. female   HPI  History reviewed. No pertinent past medical history.  Seen in consultation at the request of Dr. Roxan Hockey for biliary colic versus chronic cholecystitis.  She reports few week history approximately 3-4 of right-sided abdominal pain.  Also reports some epigastric pain.  Pain is sharp moderate intensity worsening with certain meals.  She did came to the emergency room couple weeks ago due to the severity of the pain.  She denies any fevers or chills.  No evidence of bleeding or obstruction.  She did have appropriate work-up in the ER and ultrasound of the right upper quadrant that I have personally reviewed showing evidence of cholelithiasis normal common bile duct.  CBC and CMP were completely normal She is able to perform more than 4 METS of activity without any shortness of breath or chest pain.  She did have a recent C-section 2 months ago.  She has recovered well    Past Surgical History:  Procedure Laterality Date   CESAREAN SECTION  11/13/2021    Family History  Problem Relation Age of Onset   Diabetes Maternal Grandmother    Diabetes Maternal Aunt    Breast cancer Neg Hx     Social History Social History   Tobacco Use   Smoking status: Never   Smokeless tobacco: Never  Vaping Use   Vaping Use: Never used  Substance Use Topics   Alcohol use: Never   Drug use: Never    No Known Allergies  No current outpatient medications on file.   No current facility-administered medications for this visit.     Review of Systems Full ROS  was asked and was negative except for the information on the HPI  Physical Exam Blood pressure 112/77, pulse 73, temperature 98.1 F (36.7 C), temperature source Oral, height 5\' 4"  (1.626 m), weight 204 lb 12.8 oz (92.9 kg), SpO2 98 %. CONSTITUTIONAL: NAD. EYES: Pupils are equal, round, , Sclera are  non-icteric. EARS, NOSE, MOUTH AND THROAT: The oral mucosa is pink and moist. Hearing is intact to voice. LYMPH NODES:  Lymph nodes in the neck are normal. RESPIRATORY:  Lungs are clear. There is normal respiratory effort, with equal breath sounds bilaterally, and without pathologic use of accessory muscles. CARDIOVASCULAR: Heart is regular without murmurs, gallops, or rubs. GI: The abdomen is  soft, nontender, and nondistended. There are no palpable masses. There is no hepatosplenomegaly. There are normal bowel sounds.  C-section scar there is no deep infection GU: Rectal deferred.   MUSCULOSKELETAL: Normal muscle strength and tone. No cyanosis or edema.   SKIN: Turgor is good and there are no pathologic skin lesions or ulcers. NEUROLOGIC: Motor and sensation is grossly normal. Cranial nerves are grossly intact. PSYCH:  Oriented to person, place and time. Affect is normal.  Data Reviewed  I have personally reviewed the patient's imaging, laboratory findings and medical records.    Assessment/Plan   31 year old female with biliary colic/chronic cholecystitis. I Discussed with the patient in detail about her disease process.  I do definitely recommend cholecystectomy in the timely fashion.  I do think she will be a good candidate for robotic approach. I discussed the procedure in detail.  The patient was given 38.  We discussed the risks and benefits of a laparoscopic cholecystectomy and possible cholangiogram including, but not limited to bleeding, infection,  injury to surrounding structures such as the intestine or liver, bile leak, retained gallstones, need to convert to an open procedure, prolonged diarrhea, blood clots such as  DVT, common bile duct injury, anesthesia risks, and possible need for additional procedures.  The likelihood of improvement in symptoms and return to the patient's normal status is good. We discussed the typical post-operative recovery course.  I spent  greater than 60 minutes in this encounter including personally reviewing imaging studies, coordinating her care, placing orders counseling the patient and performing appropriate documentation  Sterling Big, MD FACS General Surgeon 01/18/2022, 2:56 PM

## 2022-01-25 ENCOUNTER — Encounter
Admission: RE | Admit: 2022-01-25 | Discharge: 2022-01-25 | Disposition: A | Discharge status: Home / Self Care | Payer: Medicaid Other | Source: Ambulatory Visit | Attending: Surgery | Admitting: Surgery

## 2022-01-25 VITALS — Ht 64.0 in | Wt 204.0 lb

## 2022-01-25 DIAGNOSIS — Z01812 Encounter for preprocedural laboratory examination: Secondary | ICD-10-CM

## 2022-01-25 NOTE — Patient Instructions (Addendum)
Your procedure is scheduled on: Thursday, August 17 Report to the Registration Desk on the 1st floor of the [facility] To find out your arrival time, please call [phone] between 1PM - 3PM on: Wednesday, August 16 If your arrival time is 6:00 am, do not arrive prior to that time as the [facility] entrance doors do not open until 6:00 am.  REMEMBER: Instructions that are not followed completely may result in serious medical risk, up to and including death; or upon the discretion of your surgeon and anesthesiologist your surgery may need to be rescheduled.  Do not eat food after midnight the night before surgery.  No gum chewing, lozengers or hard candies.  You may however, drink CLEAR liquids up to 2 hours before you are scheduled to arrive for your surgery. Do not drink anything within 2 hours of your scheduled arrival time.  Clear liquids include: - water  - apple juice without pulp - gatorade (not RED colors) - black coffee or tea (Do NOT add milk or creamers to the coffee or tea) Do NOT drink anything that is not on this list.  DO NOT TAKE ANY MEDICATIONS THE MORNING OF SURGERY   One week prior to surgery: starting today, August 11 Stop Anti-inflammatories (NSAIDS) such as Advil, Aleve, Ibuprofen, Motrin, Naproxen, Naprosyn and Aspirin based products such as Excedrin, Goodys Powder, BC Powder. Stop ANY OVER THE COUNTER supplements until after surgery. You may however, continue to take Tylenol if needed for pain up until the day of surgery.  No Alcohol for 24 hours before or after surgery.  No Smoking including e-cigarettes for 24 hours prior to surgery.  No chewable tobacco products for at least 6 hours prior to surgery.  No nicotine patches on the day of surgery.  Do not use any "recreational" drugs for at least a week prior to your surgery.  Please be advised that the combination of cocaine and anesthesia may have negative outcomes, up to and including death. If you  test positive for cocaine, your surgery will be cancelled.  On the morning of surgery brush your teeth with toothpaste and water, you may rinse your mouth with mouthwash if you wish. Do not swallow any toothpaste or mouthwash.  Use CHG wipes as directed on instruction sheet.  Do not wear jewelry, make-up, hairpins, clips or nail polish.  Do not wear lotions, powders, or perfumes.   Do not shave body from the neck down 48 hours prior to surgery just in case you cut yourself which could leave a site for infection.  Also, freshly shaved skin may become irritated if using the CHG soap.  Contact lenses, hearing aids and dentures may not be worn into surgery.  Do not bring valuables to the hospital. [facility] is not responsible for any missing/lost belongings or valuables.   Notify your doctor if there is any change in your medical condition (cold, fever, infection).  Wear comfortable clothing (specific to your surgery type) to the hospital.  After surgery, you can help prevent lung complications by doing breathing exercises.  Take deep breaths and cough every 1-2 hours. Your doctor may order a device called an Incentive Spirometer to help you take deep breaths. When coughing or sneezing, hold a pillow firmly against your incision with both hands. This is called "splinting." Doing this helps protect your incision. It also decreases belly discomfort.  If you are being discharged the day of surgery, you will not be allowed to drive home. You  will need a responsible adult (18 years or older) to drive you home and stay with you that night.   If you are taking public transportation, you will need to have a responsible adult (18 years or older) with you. Please confirm with your physician that it is acceptable to use public transportation.   Please call the Pre-admissions Testing Dept. at [phone] if you have any questions about these instructions.  Surgery Visitation  Policy:  Patients undergoing a surgery or procedure may have two family members or support persons with them as long as the person is not COVID-19 positive or experiencing its symptoms.   Preparing the Skin Before Surgery     To help prevent the risk of infection at your surgical site, we are now providing you with rinse-free Sage 2% Chlorhexidine Gluconate (CHG) disposable wipes.  The night before surgery: Shower or bathe with warm water. Do not apply perfume, lotions, powders. Wait one hour after shower. Skin should be dry and cool. Open Sage wipe package - 6 disposable cloths are inside. Wipe body using one cloth for the right arm, one cloth for the left arm, one cloth for the right leg, one cloth for the left leg, one cloth for the chest/abdomen area (do not use on breasts if breast feeding), and one cloth for the back. 5. Do not rinse, allow to dry. 6. Skin may fee "tacky" for several minutes. 7. Dress in clean clothes. 8. Place clean sheets on your bed and do not sleep with pets.  REPEAT ABOVE ON THE MORNING OF SURGERY PRIOR TO ARRIVING TO THE HOSPITAL.    Su procedimiento est programado para: jueves 17 de [place] de Chartered certified accountant piso del [facility] Para averiguar su hora de llegada, llame al [phone] entre la 1 p. m. y las 3 p. m. el: mircoles 16 de agosto Si su hora de llegada es a las 6:00 a. m., no llegue antes de esa hora ya que las puertas de [place] del [facility] no se abren hasta las 6:00 a. m.  RECORDAR: Las instrucciones que no se siguen por Airline pilot en un riesgo mdico grave, que puede incluir la [place]; o segn el criterio de su cirujano y Scientific laboratory technician, es posible que sea Aeronautical engineer su Leisure centre manager.  No coma alimentos despus de la medianoche de la noche anterior a la ciruga. No masticar chicle, pastillas o caramelos duros.  Sin embargo, puede beber lquidos [employer] 2 horas antes de la  fecha prevista para la [place]. No beba nada dentro de las 2 horas de su hora de llegada programada.  Los lquidos claros incluyen: - agua - jugo de manzana sin pulpa - gatorade (no colores ROJOS) - caf o t solo (NO agregue leche o cremas al caf o t) NO beba nada que no est en esta lista.  NO TOME NINGN MEDICAMENTO LA MAANA DE LA CIRUGA  Una semana antes de la ciruga: a partir de [place], 11 de agosto Detener los antiinflamatorios (AINE) como Advil, Aleve, Ibuprofen, Motrin, Naproxen, Naprosyn y productos a base de aspirina como Excedrin, Goodys Powder, [employer]. Suspenda CUALQUIER suplemento de venta libre hasta despus de la [place]. Sin embargo, puede Educational psychologist tomando Tylenol si es necesario para Marketing executive de la [place].  No alcohol por 24 horas antes o despus de la [place].  No fumar, incluidos los cigarrillos electrnicos, durante las 24 horas previas a la [place]. No productos  de tabaco masticables durante al menos 6 horas antes de la [place]. Sin parches de Optometrist de la [place].  No use ningn medicamento "recreativo" durante al menos una semana antes de su [place]. Tenga en cuenta que la combinacin de cocana y anestesia puede [address], incluso la [place]. Si la prueba de Musician positivo, se cancelar la ciruga.  En la maana de la ciruga cepllese los dientes con pasta dental y agua, [place] enjuagarse la boca con enjuague bucal si lo desea. No trague ninguna pasta de dientes o enjuague bucal.  Use toallitas CHG como se indica en la hoja de instrucciones.  No use joyas, maquillaje, horquillas para el cabello, clips o esmalte de uas.  No use lociones, talcos ni perfumes.  No se afeite el cuerpo desde el cuello hacia abajo 48 horas antes de la ciruga en caso de que se corte, lo que podra dejar un sitio para la infeccin. Adems, la piel recin afeitada puede irritarse si se [place] el jabn CHG.  No se pueden usar lentes  de contacto, audfonos ni dentaduras postizas en la ciruga.  No lleve objetos de valor al hospital. [facility] no se responsabiliza por pertenencias u objetos de valor extraviados o perdidos.  Informe a su mdico si hay algn cambio en su condicin mdica (resfriado, fiebre, infeccin).  Use ropa cmoda (especfica para su tipo de [place]) al hospital.  Despus de la ciruga, puede ayudar a prevenir complicaciones pulmonares haciendo ejercicios de respiracin. Tome respiraciones profundas y tosa cada 1-2 horas. Su mdico puede ordenar un dispositivo llamado espirmetro de incentivo para ayudarlo a respirar profundamente. Al toser o estornudar, sostenga una almohada firmemente contra la incisin con ambas manos. Esto se llama "entablillado". Hacer esto ayuda a proteger su incisin. Tambin disminuye las molestias abdominales.  Si le dan de alta el da de la [place], no se le permitir conducir hasta su casa. Necesitar un adulto responsable (mayor de 18 aos) que lo lleve a su casa y se quede con usted esa noche.  Si viaja en transporte pblico, deber ir acompaado de un adulto responsable (mayor de 18 aos). Confirme con su mdico que es aceptable usar el transporte pblico.  Llame al [place] de [place]mis[place] al [phone] si tiene alguna pregunta sobre estas instrucciones.  Poltica de visitas a la ciruga:  Los [employer] se someten a [place] o procedimiento pueden [employer] familiares o personas de apoyo con ellos, siempre que la persona no sea COVID-19 positiva o experimente sus sntomas.  Preparacin de la piel antes de la ciruga  Para ayudar a prevenir el riesgo de infeccin en el sitio quirrgico, ahora le proporcionamos toallitas desechables Sage con 2 % de gluconato de clorhexidina (CHG) sin enjuague.  La noche antes de la ciruga: 1. Dchese o bese con agua tibia. 2. No aplique perfume, lociones, talcos. 3. Espere una hora despus de la ducha.  La piel debe estar seca y fresca. 4. Abra el paquete de toallitas Sage: dentro hay 6 toallitas desechables. a. Limpie el cuerpo con un pao para el brazo derecho, un pao para el brazo izquierdo, un pao para la pierna derecha, un pao para la pierna izquierda, un pao para el rea del pecho/abdomen (no lo use en los senos si est amamantando), y Neomia Dear tela para la espalda. 5. No enjuagar, dejar secar. 6. La piel puede sentirse "pegajosa" durante varios minutos. 7. Vstase con ropa limpia. 8. Coloque sbanas limpias en su cama y  no duerma con mascotas.  REPITA LO ANTERIOR EN LA MAANA DE LA CIRUGA ANTES DE [facility]ITAL

## 2022-01-30 MED ORDER — CEFAZOLIN SODIUM-DEXTROSE 2-4 GM/100ML-% IV SOLN
2.0000 g | INTRAVENOUS | Status: AC
  Administered 2022-01-31: 2 g via INTRAVENOUS

## 2022-01-30 MED ORDER — FAMOTIDINE 20 MG PO TABS: 20.0000 mg | ORAL_TABLET | Freq: Once | ORAL | Status: AC

## 2022-01-30 MED ORDER — CHLORHEXIDINE GLUCONATE 0.12 % MT SOLN: 15.0000 mL | Freq: Once | OROMUCOSAL | Status: AC

## 2022-01-30 MED ORDER — INDOCYANINE GREEN 25 MG IV SOLR
2.5000 mg | Freq: Once | INTRAVENOUS | Status: AC
  Administered 2022-01-31: 2.5 mg via INTRAVENOUS
  Filled 2022-01-30: qty 1

## 2022-01-30 MED ORDER — ACETAMINOPHEN 500 MG PO TABS: 1000.0000 mg | ORAL_TABLET | ORAL | Status: AC

## 2022-01-30 MED ORDER — LACTATED RINGERS IV SOLN: INTRAVENOUS | Status: DC

## 2022-01-30 MED ORDER — CELECOXIB 200 MG PO CAPS: 200.0000 mg | ORAL_CAPSULE | ORAL | Status: AC

## 2022-01-30 MED ORDER — ORAL CARE MOUTH RINSE: 15.0000 mL | Freq: Once | OROMUCOSAL | Status: AC

## 2022-01-30 MED ORDER — CHLORHEXIDINE GLUCONATE CLOTH 2 % EX PADS: 6.0000 | MEDICATED_PAD | Freq: Once | CUTANEOUS | Status: DC

## 2022-01-31 ENCOUNTER — Ambulatory Visit: Payer: Medicaid Other | Admitting: Certified Registered"

## 2022-01-31 ENCOUNTER — Ambulatory Visit
Admission: RE | Admit: 2022-01-31 | Discharge: 2022-01-31 | Disposition: A | Discharge status: Home / Self Care | Payer: Medicaid Other | Attending: Surgery | Admitting: Surgery

## 2022-01-31 ENCOUNTER — Other Ambulatory Visit: Payer: Self-pay

## 2022-01-31 ENCOUNTER — Encounter: Payer: Self-pay | Admitting: Surgery

## 2022-01-31 ENCOUNTER — Encounter
Admission: RE | Disposition: A | Discharge status: Home / Self Care | Payer: Self-pay | Source: Home / Self Care | Attending: Surgery

## 2022-01-31 DIAGNOSIS — K8020 Calculus of gallbladder without cholecystitis without obstruction: Secondary | ICD-10-CM

## 2022-01-31 DIAGNOSIS — Z87891 Personal history of nicotine dependence: Secondary | ICD-10-CM | POA: Insufficient documentation

## 2022-01-31 DIAGNOSIS — K811 Chronic cholecystitis: Secondary | ICD-10-CM | POA: Diagnosis present

## 2022-01-31 DIAGNOSIS — K8010 Calculus of gallbladder with chronic cholecystitis without obstruction: Secondary | ICD-10-CM | POA: Diagnosis not present

## 2022-01-31 DIAGNOSIS — K8060 Calculus of gallbladder and bile duct with cholecystitis, unspecified, without obstruction: Secondary | ICD-10-CM | POA: Diagnosis not present

## 2022-01-31 DIAGNOSIS — Z01812 Encounter for preprocedural laboratory examination: Secondary | ICD-10-CM

## 2022-01-31 LAB — POCT PREGNANCY, URINE: Preg Test, Ur: NEGATIVE

## 2022-01-31 SURGERY — CHOLECYSTECTOMY, ROBOT-ASSISTED, LAPAROSCOPIC
Anesthesia: General

## 2022-01-31 MED ORDER — CHLORHEXIDINE GLUCONATE 0.12 % MT SOLN
OROMUCOSAL | Status: AC
  Administered 2022-01-31: 15 mL via OROMUCOSAL
  Filled 2022-01-31: qty 15

## 2022-01-31 MED ORDER — CELECOXIB 200 MG PO CAPS
ORAL_CAPSULE | ORAL | Status: AC
  Administered 2022-01-31: 200 mg via ORAL
  Filled 2022-01-31: qty 1

## 2022-01-31 MED ORDER — ACETAMINOPHEN 500 MG PO TABS
ORAL_TABLET | ORAL | Status: AC
  Administered 2022-01-31: 1000 mg via ORAL
  Filled 2022-01-31: qty 2

## 2022-01-31 MED ORDER — CEFAZOLIN SODIUM-DEXTROSE 2-4 GM/100ML-% IV SOLN
INTRAVENOUS | Status: AC
  Filled 2022-01-31: qty 100

## 2022-01-31 MED ORDER — DEXAMETHASONE SODIUM PHOSPHATE 10 MG/ML IJ SOLN
INTRAMUSCULAR | Status: DC | PRN
  Administered 2022-01-31: 10 mg via INTRAVENOUS

## 2022-01-31 MED ORDER — FENTANYL CITRATE (PF) 100 MCG/2ML IJ SOLN: 25.0000 ug | INTRAMUSCULAR | Status: DC | PRN

## 2022-01-31 MED ORDER — ONDANSETRON HCL 4 MG/2ML IJ SOLN
INTRAMUSCULAR | Status: DC | PRN
  Administered 2022-01-31: 4 mg via INTRAVENOUS

## 2022-01-31 MED ORDER — KETOROLAC TROMETHAMINE 30 MG/ML IJ SOLN
INTRAMUSCULAR | Status: DC | PRN
  Administered 2022-01-31: 30 mg via INTRAVENOUS

## 2022-01-31 MED ORDER — MIDAZOLAM HCL 2 MG/2ML IJ SOLN
INTRAMUSCULAR | Status: DC | PRN
  Administered 2022-01-31: 2 mg via INTRAVENOUS

## 2022-01-31 MED ORDER — SUGAMMADEX SODIUM 200 MG/2ML IV SOLN
INTRAVENOUS | Status: DC | PRN
  Administered 2022-01-31: 200 mg via INTRAVENOUS

## 2022-01-31 MED ORDER — PROPOFOL 10 MG/ML IV BOLUS
INTRAVENOUS | Status: DC | PRN
  Administered 2022-01-31: 150 mg via INTRAVENOUS

## 2022-01-31 MED ORDER — STERILE WATER FOR IRRIGATION IR SOLN
Status: DC | PRN
  Administered 2022-01-31: 3000 mL

## 2022-01-31 MED ORDER — LIDOCAINE HCL (CARDIAC) PF 100 MG/5ML IV SOSY
PREFILLED_SYRINGE | INTRAVENOUS | Status: DC | PRN
  Administered 2022-01-31: 50 mg via INTRAVENOUS

## 2022-01-31 MED ORDER — BUPIVACAINE LIPOSOME 1.3 % IJ SUSP
INTRAMUSCULAR | Status: AC
Start: 2022-01-31 — End: ?
  Filled 2022-01-31: qty 20

## 2022-01-31 MED ORDER — FENTANYL CITRATE (PF) 100 MCG/2ML IJ SOLN
INTRAMUSCULAR | Status: DC | PRN
  Administered 2022-01-31 (×3): 50 ug via INTRAVENOUS

## 2022-01-31 MED ORDER — SUCCINYLCHOLINE CHLORIDE 200 MG/10ML IV SOSY
PREFILLED_SYRINGE | INTRAVENOUS | Status: DC | PRN
  Administered 2022-01-31: 100 mg via INTRAVENOUS

## 2022-01-31 MED ORDER — 0.9 % SODIUM CHLORIDE (POUR BTL) OPTIME
TOPICAL | Status: DC | PRN
  Administered 2022-01-31: 500 mL

## 2022-01-31 MED ORDER — FENTANYL CITRATE (PF) 100 MCG/2ML IJ SOLN
INTRAMUSCULAR | Status: AC
  Filled 2022-01-31: qty 2

## 2022-01-31 MED ORDER — OXYCODONE HCL 5 MG/5ML PO SOLN: 5.0000 mg | Freq: Once | ORAL | Status: AC | PRN

## 2022-01-31 MED ORDER — MIDAZOLAM HCL 2 MG/2ML IJ SOLN
INTRAMUSCULAR | Status: AC
  Filled 2022-01-31: qty 2

## 2022-01-31 MED ORDER — KETOROLAC TROMETHAMINE 30 MG/ML IJ SOLN
INTRAMUSCULAR | Status: AC
  Filled 2022-01-31: qty 1

## 2022-01-31 MED ORDER — BUPIVACAINE-EPINEPHRINE (PF) 0.5% -1:200000 IJ SOLN
INTRAMUSCULAR | Status: AC
  Filled 2022-01-31: qty 30

## 2022-01-31 MED ORDER — FAMOTIDINE 20 MG PO TABS
ORAL_TABLET | ORAL | Status: AC
  Administered 2022-01-31: 20 mg via ORAL
  Filled 2022-01-31: qty 1

## 2022-01-31 MED ORDER — BUPIVACAINE-EPINEPHRINE (PF) 0.5% -1:200000 IJ SOLN
INTRAMUSCULAR | Status: DC | PRN
  Administered 2022-01-31: 50 mL

## 2022-01-31 MED ORDER — HYDROCODONE-ACETAMINOPHEN 5-325 MG PO TABS: 1.0000 | ORAL_TABLET | ORAL | 0 refills | Status: AC | PRN

## 2022-01-31 MED ORDER — ROCURONIUM BROMIDE 100 MG/10ML IV SOLN
INTRAVENOUS | Status: DC | PRN
  Administered 2022-01-31: 30 mg via INTRAVENOUS

## 2022-01-31 MED ORDER — OXYCODONE HCL 5 MG PO TABS
ORAL_TABLET | ORAL | Status: AC
  Filled 2022-01-31: qty 1

## 2022-01-31 MED ORDER — PROPOFOL 10 MG/ML IV BOLUS
INTRAVENOUS | Status: AC
  Filled 2022-01-31: qty 20

## 2022-01-31 MED ORDER — OXYCODONE HCL 5 MG PO TABS
5.0000 mg | ORAL_TABLET | Freq: Once | ORAL | Status: AC | PRN
  Administered 2022-01-31: 5 mg via ORAL

## 2022-01-31 SURGICAL SUPPLY — 50 items
CANNULA REDUC XI 12-8 STAPL (CANNULA) ×1
CANNULA REDUCER 12-8 DVNC XI (CANNULA) ×1 IMPLANT
CATH REDDICK CHOLANGI 4FR 50CM (CATHETERS) IMPLANT
CLIP LIGATING HEMO O LOK GREEN (MISCELLANEOUS) ×1 IMPLANT
DERMABOND ADVANCED (GAUZE/BANDAGES/DRESSINGS) ×1
DERMABOND ADVANCED .7 DNX12 (GAUZE/BANDAGES/DRESSINGS) ×1 IMPLANT
DRAPE ARM DVNC X/XI (DISPOSABLE) ×4 IMPLANT
DRAPE COLUMN DVNC XI (DISPOSABLE) ×1 IMPLANT
DRAPE DA VINCI XI ARM (DISPOSABLE) ×4
DRAPE DA VINCI XI COLUMN (DISPOSABLE) ×1
ELECT CAUTERY BLADE 6.4 (BLADE) ×1 IMPLANT
ELECT REM PT RETURN 9FT ADLT (ELECTROSURGICAL) ×1
ELECTRODE REM PT RTRN 9FT ADLT (ELECTROSURGICAL) ×1 IMPLANT
GLOVE BIO SURGEON STRL SZ7 (GLOVE) ×2 IMPLANT
GOWN STRL REUS W/ TWL LRG LVL3 (GOWN DISPOSABLE) ×4 IMPLANT
GOWN STRL REUS W/TWL LRG LVL3 (GOWN DISPOSABLE) ×4
IRRIGATION STRYKERFLOW (MISCELLANEOUS) IMPLANT
IRRIGATOR STRYKERFLOW (MISCELLANEOUS) ×1
IV CATH ANGIO 12GX3 LT BLUE (NEEDLE) IMPLANT
KIT PINK PAD W/HEAD ARE REST (MISCELLANEOUS) ×1
KIT PINK PAD W/HEAD ARM REST (MISCELLANEOUS) ×1 IMPLANT
LABEL OR SOLS (LABEL) ×1 IMPLANT
MANIFOLD NEPTUNE II (INSTRUMENTS) ×1 IMPLANT
NEEDLE HYPO 22GX1.5 SAFETY (NEEDLE) ×1 IMPLANT
NS IRRIG 500ML POUR BTL (IV SOLUTION) ×1 IMPLANT
OBTURATOR OPTICAL STANDARD 8MM (TROCAR) ×1
OBTURATOR OPTICAL STND 8 DVNC (TROCAR) ×1
OBTURATOR OPTICALSTD 8 DVNC (TROCAR) ×1 IMPLANT
PACK LAP CHOLECYSTECTOMY (MISCELLANEOUS) ×1 IMPLANT
PENCIL SMOKE EVACUATOR (MISCELLANEOUS) ×1 IMPLANT
SEAL CANN UNIV 5-8 DVNC XI (MISCELLANEOUS) ×3 IMPLANT
SEAL XI 5MM-8MM UNIVERSAL (MISCELLANEOUS) ×3
SET TUBE SMOKE EVAC HIGH FLOW (TUBING) ×1 IMPLANT
SOLUTION ELECTROLUBE (MISCELLANEOUS) ×1 IMPLANT
SPIKE FLUID TRANSFER (MISCELLANEOUS) ×1 IMPLANT
SPONGE T-LAP 18X18 ~~LOC~~+RFID (SPONGE) ×1 IMPLANT
SPONGE T-LAP 4X18 ~~LOC~~+RFID (SPONGE) IMPLANT
STAPLER CANNULA SEAL DVNC XI (STAPLE) ×1 IMPLANT
STAPLER CANNULA SEAL XI (STAPLE) ×1
STOPCOCK 3 WAY MALE LL (IV SETS)
STOPCOCK 3WAY MALE LL (IV SETS) IMPLANT
SUT MNCRL AB 4-0 PS2 18 (SUTURE) ×1 IMPLANT
SUT VICRYL 0 AB UR-6 (SUTURE) ×2 IMPLANT
SYR 20ML LL LF (SYRINGE) ×1 IMPLANT
SYR 30ML LL (SYRINGE) ×1 IMPLANT
SYS BAG RETRIEVAL 10MM (BASKET) ×1
SYSTEM BAG RETRIEVAL 10MM (BASKET) ×1 IMPLANT
TRAP FLUID SMOKE EVACUATOR (MISCELLANEOUS) ×1 IMPLANT
WATER STERILE IRR 3000ML UROMA (IV SOLUTION) IMPLANT
WATER STERILE IRR 500ML POUR (IV SOLUTION) ×1 IMPLANT

## 2022-01-31 NOTE — Anesthesia Preprocedure Evaluation (Signed)
Anesthesia Evaluation  Patient identified by MRN, date of birth, ID band Patient awake    Reviewed: Allergy & Precautions, NPO status , Patient's Chart, lab work & pertinent test results  History of Anesthesia Complications Negative for: history of anesthetic complications  Airway Mallampati: III  TM Distance: >3 FB Neck ROM: full    Dental  (+) Chipped, Poor Dentition   Pulmonary neg pulmonary ROS, neg shortness of breath, former smoker,    Pulmonary exam normal        Cardiovascular Exercise Tolerance: Good (-) angina(-) Past MI and (-) DOE negative cardio ROS Normal cardiovascular exam     Neuro/Psych negative neurological ROS  negative psych ROS   GI/Hepatic negative GI ROS, Neg liver ROS, neg GERD  ,  Endo/Other  negative endocrine ROS  Renal/GU      Musculoskeletal   Abdominal   Peds  Hematology negative hematology ROS (+)   Anesthesia Other Findings Past Medical History: 01/2022: Biliary colic  Past Surgical History: 11/13/2021: CESAREAN SECTION No date: TONSILLECTOMY  BMI    Body Mass Index: 35.02 kg/m      Reproductive/Obstetrics negative OB ROS                             Anesthesia Physical Anesthesia Plan  ASA: 2  Anesthesia Plan: General ETT   Post-op Pain Management:    Induction: Intravenous  PONV Risk Score and Plan: Ondansetron, Dexamethasone, Midazolam and Treatment may vary due to age or medical condition  Airway Management Planned: Oral ETT  Additional Equipment:   Intra-op Plan:   Post-operative Plan: Extubation in OR  Informed Consent: I have reviewed the patients History and Physical, chart, labs and discussed the procedure including the risks, benefits and alternatives for the proposed anesthesia with the patient or authorized representative who has indicated his/her understanding and acceptance.     Dental Advisory Given and Interpreter  used for interveiw  Plan Discussed with: Anesthesiologist, CRNA and Surgeon  Anesthesia Plan Comments: (Patient consented for risks of anesthesia including but not limited to:  - adverse reactions to medications - damage to eyes, teeth, lips or other oral mucosa - nerve damage due to positioning  - sore throat or hoarseness - Damage to heart, brain, nerves, lungs, other parts of body or loss of life  Patient voiced understanding.)        Anesthesia Quick Evaluation

## 2022-01-31 NOTE — Anesthesia Procedure Notes (Signed)
Procedure Name: Intubation Date/Time: 01/31/2022 10:43 AM  Performed by: Cheral Bay, CRNAPre-anesthesia Checklist: Patient identified, Emergency Drugs available, Suction available and Patient being monitored Patient Re-evaluated:Patient Re-evaluated prior to induction Oxygen Delivery Method: Circle system utilized Preoxygenation: Pre-oxygenation with 100% oxygen Induction Type: IV induction Ventilation: Mask ventilation without difficulty Laryngoscope Size: McGraph and 3 Grade View: Grade I Tube type: Oral Tube size: 7.0 mm Number of attempts: 1 Airway Equipment and Method: Stylet Placement Confirmation: ETT inserted through vocal cords under direct vision, positive ETCO2 and breath sounds checked- equal and bilateral Secured at: 21 cm Tube secured with: Tape Dental Injury: Teeth and Oropharynx as per pre-operative assessment

## 2022-01-31 NOTE — Discharge Instructions (Signed)

## 2022-01-31 NOTE — Anesthesia Postprocedure Evaluation (Signed)
Anesthesia Post Note  Patient: Larayah Clute  Procedure(s) Performed: XI ROBOTIC ASSISTED LAPAROSCOPIC CHOLECYSTECTOMY INDOCYANINE GREEN FLUORESCENCE IMAGING (ICG)  Patient location during evaluation: PACU Anesthesia Type: General Level of consciousness: awake and alert Pain management: pain level controlled Vital Signs Assessment: post-procedure vital signs reviewed and stable Respiratory status: spontaneous breathing, nonlabored ventilation, respiratory function stable and patient connected to nasal cannula oxygen Cardiovascular status: blood pressure returned to baseline and stable Postop Assessment: no apparent nausea or vomiting Anesthetic complications: no   No notable events documented.   Last Vitals:  Vitals:   01/31/22 1244 01/31/22 1326  BP: 130/75 108/70  Pulse: 68 60  Resp: 18   Temp: (!) 35.9 C   SpO2: 98% 100%    Last Pain:  Vitals:   01/31/22 1326  TempSrc:   PainSc: 5                  Cleda Mccreedy Kiyaan Haq

## 2022-01-31 NOTE — Transfer of Care (Signed)
Immediate Anesthesia Transfer of Care Note  Patient: Morgan Chen  Procedure(s) Performed: XI ROBOTIC ASSISTED LAPAROSCOPIC CHOLECYSTECTOMY INDOCYANINE GREEN FLUORESCENCE IMAGING (ICG)  Patient Location: PACU  Anesthesia Type:General  Level of Consciousness: drowsy and patient cooperative  Airway & Oxygen Therapy: Patient Spontanous Breathing and Patient connected to nasal cannula oxygen  Post-op Assessment: Report given to RN and Post -op Vital signs reviewed and stable  Post vital signs: Reviewed and stable  Last Vitals:  Vitals Value Taken Time  BP 122/72 01/31/22 1143  Temp    Pulse 67 01/31/22 1143  Resp 13 01/31/22 1143  SpO2 100 % 01/31/22 1143  Vitals shown include unvalidated device data.  Last Pain:  Vitals:   01/31/22 0949  TempSrc: Temporal  PainSc: 0-No pain         Complications: No notable events documented.

## 2022-01-31 NOTE — Interval H&P Note (Signed)
History and Physical Interval Note:  01/31/2022 10:05 AM  Morgan Chen  has presented today for surgery, with the diagnosis of biliary colie.  The various methods of treatment have been discussed with the patient and family. After consideration of risks, benefits and other options for treatment, the patient has consented to  Procedure(s): XI ROBOTIC ASSISTED LAPAROSCOPIC CHOLECYSTECTOMY (N/A) INDOCYANINE GREEN FLUORESCENCE IMAGING (ICG) (N/A) as a surgical intervention.  The patient's history has been reviewed, patient examined, no change in status, stable for surgery.  I have reviewed the patient's chart and labs.  Questions were answered to the patient's satisfaction.     Morgan Chen F Morgan Chen

## 2022-01-31 NOTE — Op Note (Signed)
Robotic assisted laparoscopic Cholecystectomy  Pre-operative Diagnosis: Chronic cholecystitis  Post-operative Diagnosis: same  Procedure:  Robotic assisted laparoscopic Cholecystectomy  Surgeon: Sterling Big, MD FACS  Anesthesia: Gen. with endotracheal tube  Findings: Chronic mild Cholecystitis   Estimated Blood Loss: 5cc       Specimens: Gallbladder           Complications: none   Procedure Details  The patient was seen again in the Holding Room. The benefits, complications, treatment options, and expected outcomes were discussed with the patient. The risks of bleeding, infection, recurrence of symptoms, failure to resolve symptoms, bile duct damage, bile duct leak, retained common bile duct stone, bowel injury, any of which could require further surgery and/or ERCP, stent, or papillotomy were reviewed with the patient. The likelihood of improving the patient's symptoms with return to their baseline status is good.  The patient and/or family concurred with the proposed plan, giving informed consent.  The patient was taken to Operating Room, identified  and the procedure verified as Laparoscopic Cholecystectomy.  A Time Out was held and the above information confirmed.  Prior to the induction of general anesthesia, antibiotic prophylaxis was administered. VTE prophylaxis was in place. General endotracheal anesthesia was then administered and tolerated well. After the induction, the abdomen was prepped with Chloraprep and draped in the sterile fashion. The patient was positioned in the supine position.  Cut down technique was used to enter the abdominal cavity and a Hasson trochar was placed after two vicryl stitches were anchored to the fascia. Pneumoperitoneum was then created with CO2 and tolerated well without any adverse changes in the patient's vital signs.  Three 8-mm ports were placed under direct vision. All skin incisions  were infiltrated with a local anesthetic agent before  making the incision and placing the trocars.   The patient was positioned  in reverse Trendelenburg, robot was brought to the surgical field and docked in the standard fashion.  We made sure all the instrumentation was kept indirect view at all times and that there were no collision between the arms. I scrubbed out and went to the console.  The gallbladder was identified, the fundus grasped and retracted cephalad. Adhesions were lysed bluntly. The infundibulum was grasped and retracted laterally, exposing the peritoneum overlying the triangle of Calot. This was then divided and exposed in a blunt fashion. An extended critical view of the cystic duct and cystic artery was obtained.  The cystic duct was clearly identified and bluntly dissected.   Artery and duct were double clipped and divided. Using ICG cholangiography we visualize the cystic duct and CBD, no evidence of bile injuries. The gallbladder was taken from the gallbladder fossa in a retrograde fashion with the electrocautery.  Hemostasis was achieved with the electrocautery. nspection of the right upper quadrant was performed. No bleeding, bile duct injury or leak, or bowel injury was noted. Robotic instruments and robotic arms were undocked in the standard fashion.  I scrubbed back in.  The gallbladder was removed and placed in an Endocatch bag.   Pneumoperitoneum was released.  The periumbilical port site was closed with interrumpted 0 Vicryl sutures. 4-0 subcuticular Monocryl was used to close the skin. Dermabond was  applied.  The patient was then extubated and brought to the recovery room in stable condition. Sponge, lap, and needle counts were correct at closure and at the conclusion of the case.               Sterling Big, MD, FACS

## 2022-02-04 LAB — SURGICAL PATHOLOGY

## 2022-02-14 ENCOUNTER — Ambulatory Visit (INDEPENDENT_AMBULATORY_CARE_PROVIDER_SITE_OTHER): Payer: Medicaid Other | Admitting: Physician Assistant

## 2022-02-14 ENCOUNTER — Encounter: Payer: Self-pay | Admitting: Physician Assistant

## 2022-02-14 ENCOUNTER — Encounter: Payer: Medicaid Other | Admitting: Physician Assistant

## 2022-02-14 VITALS — BP 103/70 | HR 77 | Temp 98.0°F | Ht 64.0 in | Wt 205.0 lb

## 2022-02-14 DIAGNOSIS — K8020 Calculus of gallbladder without cholecystitis without obstruction: Secondary | ICD-10-CM

## 2022-02-14 DIAGNOSIS — Z09 Encounter for follow-up examination after completed treatment for conditions other than malignant neoplasm: Secondary | ICD-10-CM

## 2022-02-14 DIAGNOSIS — K8010 Calculus of gallbladder with chronic cholecystitis without obstruction: Secondary | ICD-10-CM

## 2022-02-14 NOTE — Progress Notes (Signed)
[facility] POST-OP OFFICE VISIT  02/14/2022  HPI: Morgan Chen is a 31 y.o. female 14 days s/p robotic assisted laparoscopic cholecystectomy for chronic cholecystitis with Dr Everlene Farrier  She is overall doing very well Occasional right sided and umbilical incisional soreness but this is mild No fever, chills, nausea, emesis, or bowel changes No issues with PO intake No issues with incisions themselves No other complaints   Vital signs: BP 103/70   Pulse 77   Temp 98 F (36.7 C)   Ht 5\' 4"  (1.626 m)   Wt 205 lb (93 kg)   SpO2 97%   BMI 35.19 kg/m    Physical Exam: Constitutional: Well appearing female, NAD Abdomen: Soft, non-tender, non-distended, no rebound/guarding Skin: Laparoscopic incisions are healing well, no erythema or drainage   Assessment/Plan: This is a 31 y.o. female 14 days s/p robotic assisted laparoscopic cholecystectomy for chronic cholecystitis with Dr 38   - Pain control prn  - Reviewed wound care recommendation  - Reviewed lifting restrictions; 4 weeks total  - Reviewed surgical pathology; CCC  - She can follow up on as needed basis; She understands to call with questions/concerns  -- Everlene Farrier, PA-C [facility] 02/14/2022, 11:37 AM M-F: 7am - 4pm

## 2022-02-14 NOTE — Patient Instructions (Addendum)
Follow-up with our office as needed.  Please call and ask to speak with a nurse if you develop questions or concerns.  You can start exercising in 2 more weeks.   GENERAL POST-OPERATIVE PATIENT INSTRUCTIONS   WOUND CARE INSTRUCTIONS:  Try to keep the wound dry and avoid ointments on the wound unless directed to do so.  If the wound becomes bright red and painful or starts to drain infected material that is not clear, please contact your physician immediately.  If the wound is mildly pink and has a thick firm ridge underneath it, this is normal, and is referred to as a healing ridge.  This will resolve over the next 4-6 weeks.  BATHING: You may shower if you have been informed of this by your surgeon. However, Please do not submerge in a tub, hot tub, or pool until incisions are completely sealed or have been told by your surgeon that you may do so.  DIET:  You may eat any foods that you can tolerate.  It is a good idea to eat a high fiber diet and take in plenty of fluids to prevent constipation.  If you do become constipated you may want to take a mild laxative or take ducolax tablets on a daily basis until your bowel habits are regular.  Constipation can be very uncomfortable, along with straining, after recent surgery.  ACTIVITY: You should not lift more than 20 pounds for 6 weeks total after surgery as it could put you at increased risk for complications.  Twenty pounds is roughly equivalent to a plastic bag of groceries. At that time- Listen to your body when lifting, if you have pain when lifting, stop and then try again in a few days. Soreness after doing exercises or activities of daily living is normal as you get back in to your normal routine.  MEDICATIONS:  Try to take narcotic medications and anti-inflammatory medications, such as tylenol, ibuprofen, naprosyn, etc., with food.  This will minimize stomach upset from the medication.  Should you develop nausea and vomiting from the pain  medication, or develop a rash, please discontinue the medication and contact your physician.  You should not drive, make important decisions, or operate machinery when taking narcotic pain medication.  SUNBLOCK Use sun block to incision area over the next year if this area will be exposed to sun. This helps decrease scarring and will allow you avoid a permanent darkened area over your incision.  QUESTIONS:  Please feel free to call our office if you have any questions, and we will be glad to assist you. [phone]

## 2023-06-01 ENCOUNTER — Emergency Department
Admission: EM | Admit: 2023-06-01 | Discharge: 2023-06-01 | Disposition: A | Discharge status: Home / Self Care | Payer: Medicaid Other | Attending: Emergency Medicine | Admitting: Emergency Medicine

## 2023-06-01 ENCOUNTER — Other Ambulatory Visit: Payer: Self-pay

## 2023-06-01 ENCOUNTER — Emergency Department: Payer: Medicaid Other

## 2023-06-01 DIAGNOSIS — N390 Urinary tract infection, site not specified: Secondary | ICD-10-CM

## 2023-06-01 DIAGNOSIS — R300 Dysuria: Secondary | ICD-10-CM | POA: Diagnosis present

## 2023-06-01 LAB — URINALYSIS, ROUTINE W REFLEX MICROSCOPIC
Bilirubin Urine: NEGATIVE
Glucose, UA: NEGATIVE mg/dL
Ketones, ur: 5 mg/dL — AB
Nitrite: NEGATIVE
Protein, ur: NEGATIVE mg/dL
Specific Gravity, Urine: 1.021 (ref 1.005–1.030)
WBC, UA: >50 WBC/hpf (ref 0–5)
pH: 5 (ref 5.0–8.0)

## 2023-06-01 LAB — PREGNANCY, URINE: Preg Test, Ur: NEGATIVE

## 2023-06-01 MED ORDER — CEPHALEXIN 500 MG PO CAPS: 500.0000 mg | ORAL_CAPSULE | Freq: Four times a day (QID) | ORAL | 0 refills | Status: AC

## 2023-06-01 MED ORDER — KETOROLAC TROMETHAMINE 30 MG/ML IJ SOLN
30.0000 mg | Freq: Once | INTRAMUSCULAR | Status: AC
  Administered 2023-06-01: 30 mg via INTRAMUSCULAR
  Filled 2023-06-01: qty 1

## 2023-06-02 ENCOUNTER — Encounter: Payer: Self-pay | Admitting: Physician Assistant

## 2023-07-23 IMAGING — US US OB COMP LESS 14 WK
1 series · 14 of 28 positions shown · non-contrast
Comparison: None.

CLINICAL DATA: Pregnant, LMP 02/21/2021, vaginal bleeding

EXAM:
OBSTETRIC <14 WK ULTRASOUND
TECHNIQUE: Transabdominal ultrasound was performed for evaluation of the
gestation as well as the maternal uterus and adnexal regions.

[Series 1: us ob less than 14 weeks with ob transvaginal · 70 acquisitions, 14 frames shown]
[im 3/70]
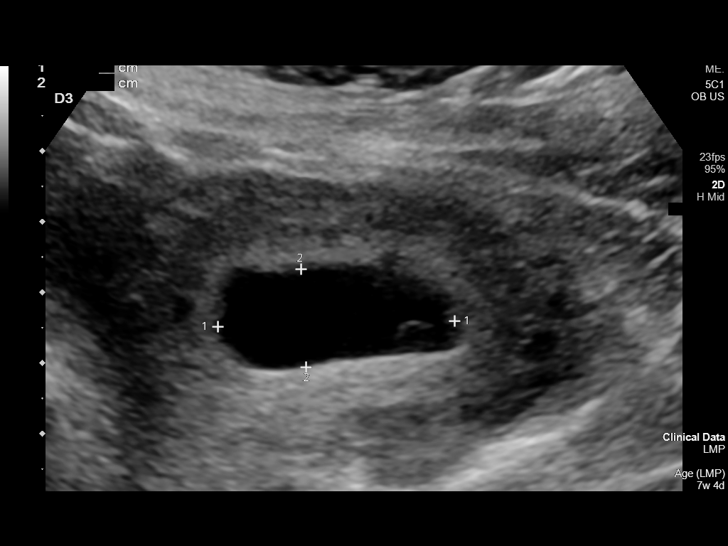
[im 8/70]
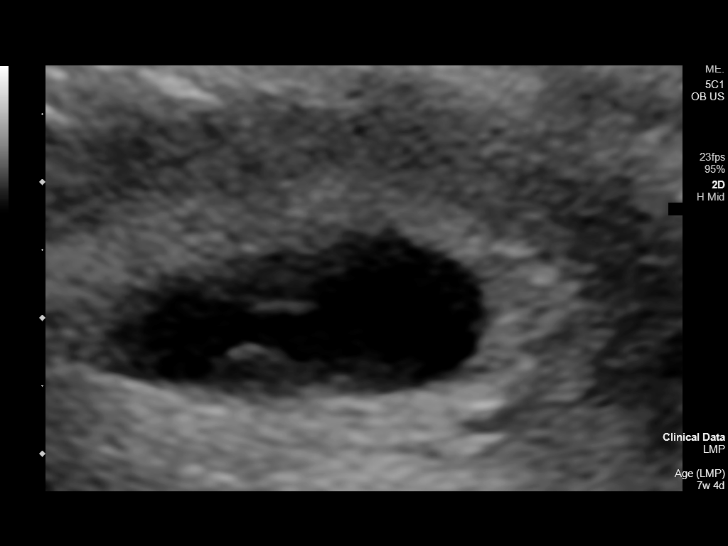
[im 13/70]
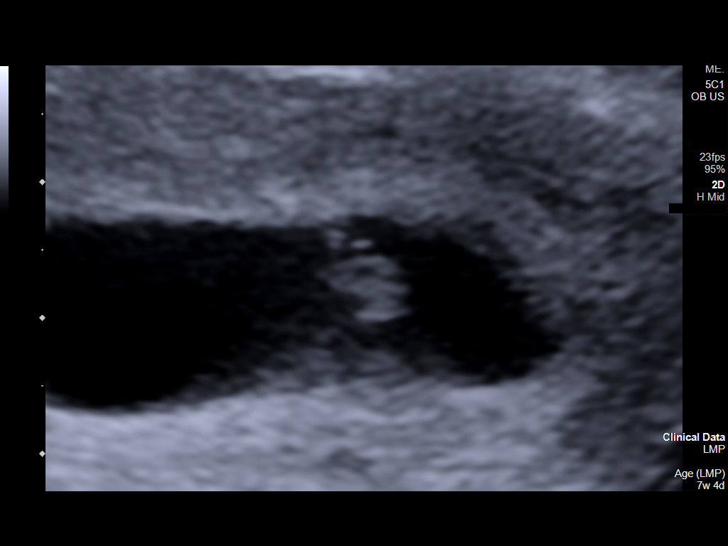
[im 18/70]
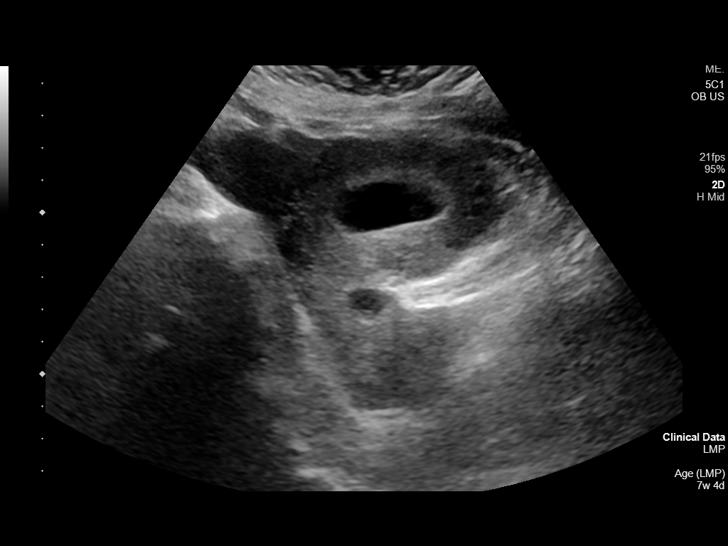
[im 24/70]
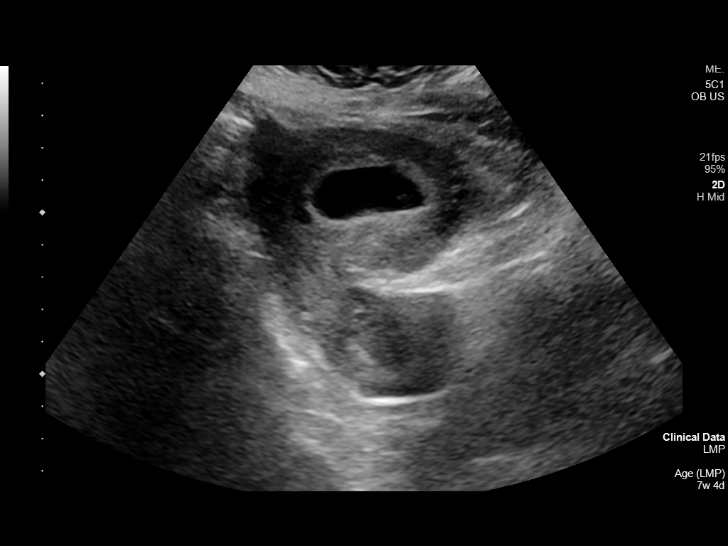
[im 29/70]
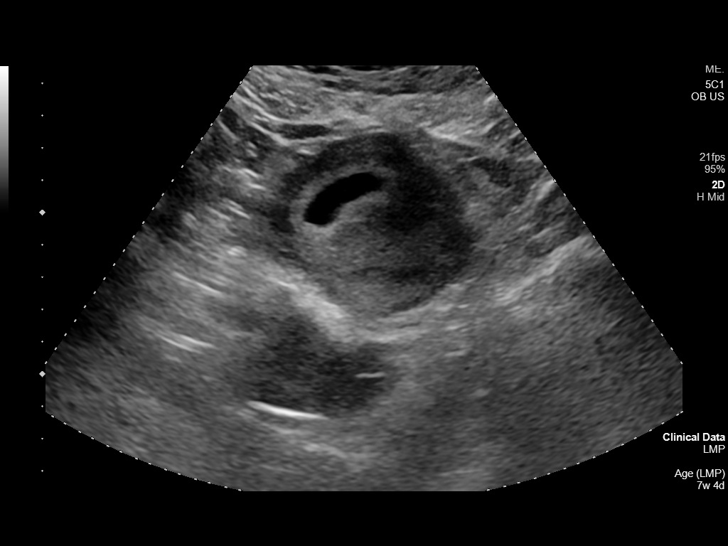
[im 34/70]
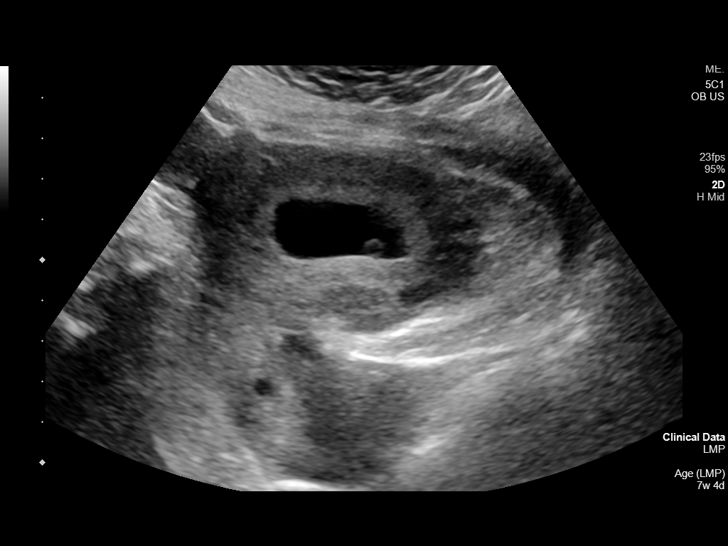
[im 39/70]
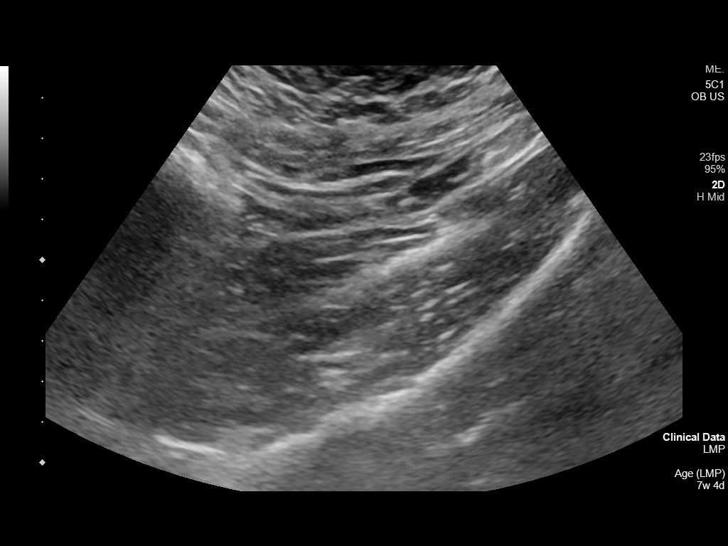
[im 44/70]
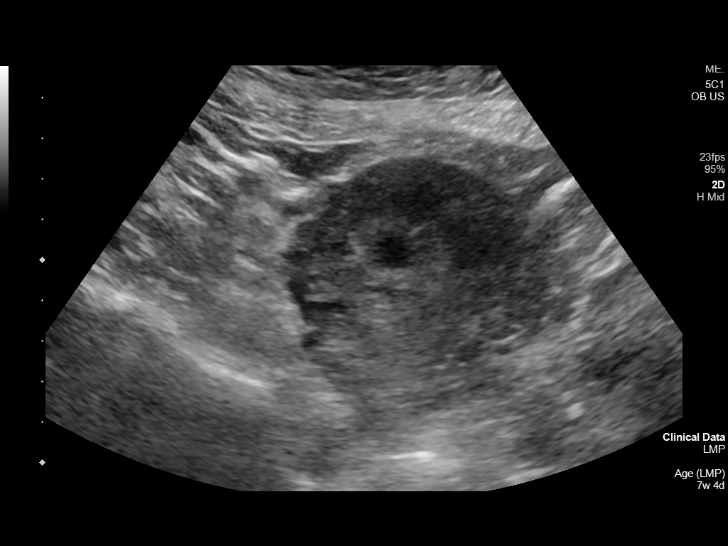
[im 49/70]
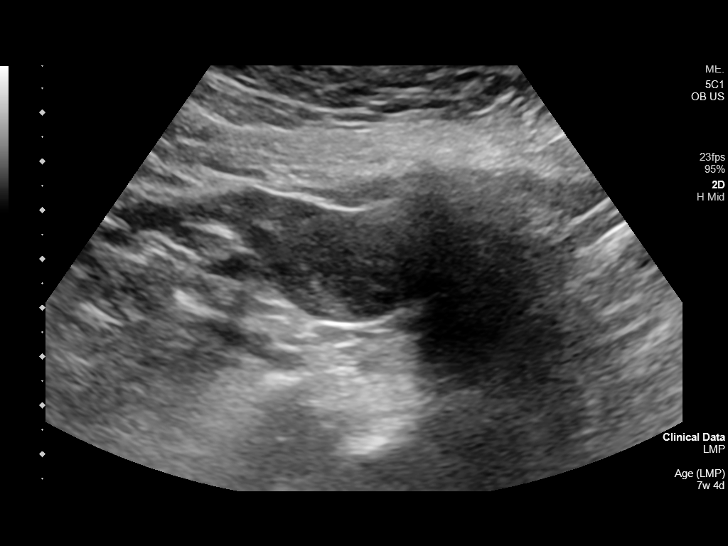
[im 54/70]
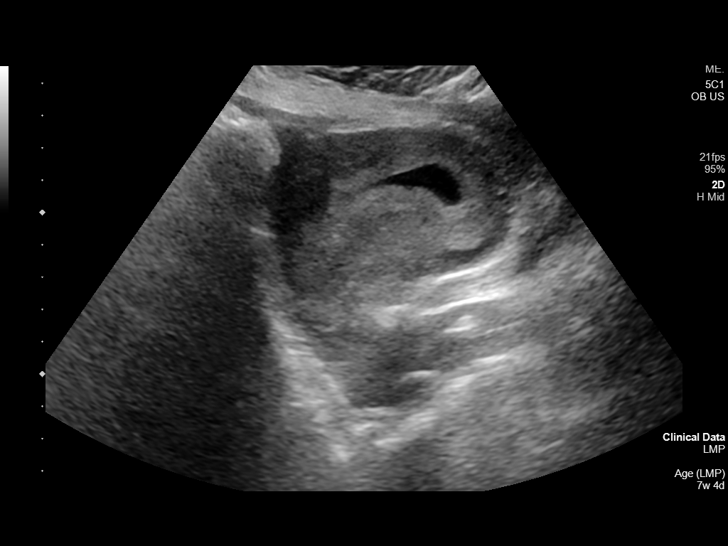
[im 59/70]
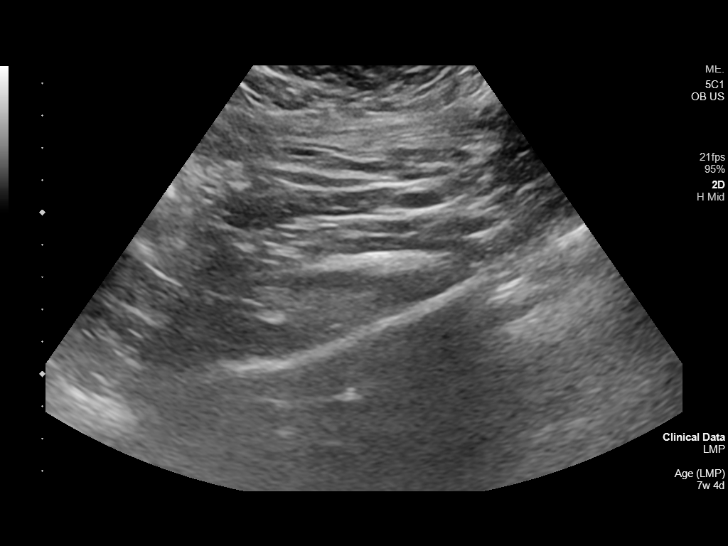
[im 64/70]
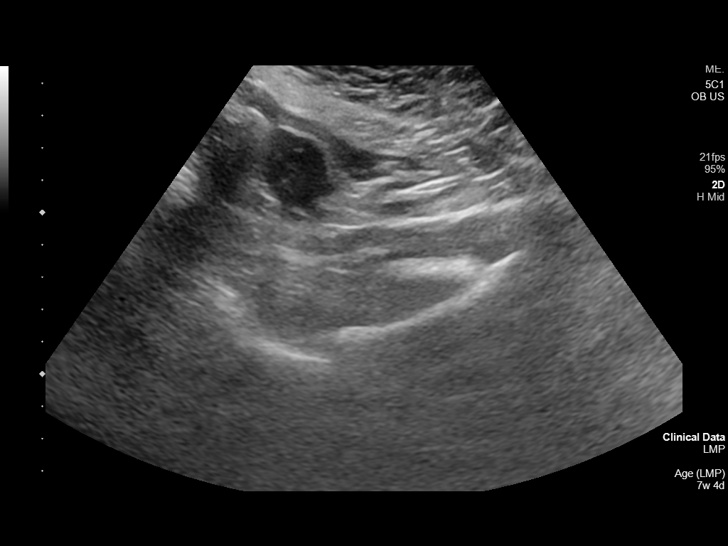
[im 70/70]
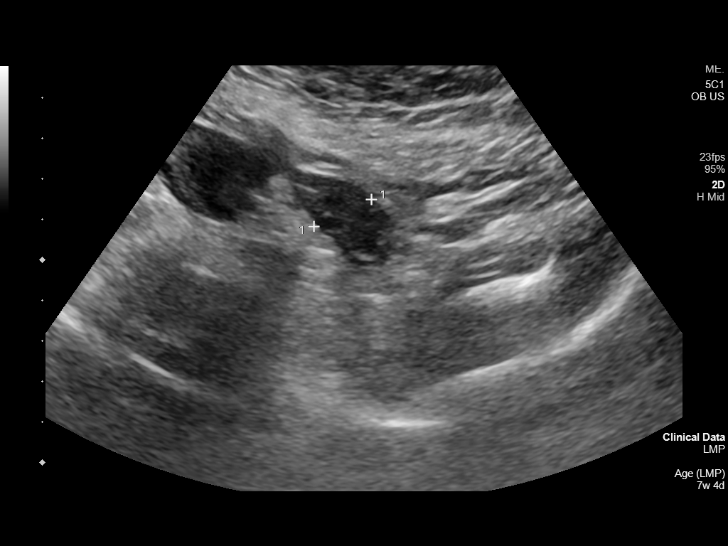

[14 of 28 positions shown; findings below may reference images not displayed]

FINDINGS: Intrauterine gestational sac: Present, single

Yolk sac:  Present, single, normal appearing

Embryo:  Present, single

Cardiac Activity: Present

Heart Rate: 136 bpm

CRL:   8 mm   6 w 5 d                  US EDC: 12/04/2020

Subchorionic hemorrhage:  None visualized.

Maternal uterus/adnexae: The uterus is anteverted. No intrauterine
masses are seen. The cervix is closed and is unremarkable. No free
fluid within the cul-de-sac. The maternal ovaries are unremarkable.
IMPRESSION: Single living intrauterine gestation with an estimated gestational
age of 6 weeks, 5 days.

## 2023-10-28 ENCOUNTER — Emergency Department
Admission: EM | Admit: 2023-10-28 | Discharge: 2023-10-29 | Disposition: A | Discharge status: Home / Self Care | Attending: Emergency Medicine | Admitting: Emergency Medicine

## 2023-10-28 ENCOUNTER — Other Ambulatory Visit: Payer: Self-pay

## 2023-10-28 DIAGNOSIS — R319 Hematuria, unspecified: Secondary | ICD-10-CM

## 2023-10-28 DIAGNOSIS — N390 Urinary tract infection, site not specified: Secondary | ICD-10-CM | POA: Diagnosis not present

## 2023-10-28 DIAGNOSIS — R1030 Lower abdominal pain, unspecified: Secondary | ICD-10-CM

## 2023-10-28 DIAGNOSIS — D72829 Elevated white blood cell count, unspecified: Secondary | ICD-10-CM | POA: Insufficient documentation

## 2023-10-28 LAB — URINALYSIS, ROUTINE W REFLEX MICROSCOPIC
Bacteria, UA: NONE SEEN
Bilirubin Urine: NEGATIVE
Glucose, UA: NEGATIVE mg/dL
Ketones, ur: NEGATIVE mg/dL
Nitrite: NEGATIVE
Protein, ur: NEGATIVE mg/dL
Specific Gravity, Urine: 1.008 (ref 1.005–1.030)
WBC, UA: >50 WBC/hpf (ref 0–5)
pH: 7 (ref 5.0–8.0)

## 2023-10-28 LAB — COMPREHENSIVE METABOLIC PANEL WITH GFR
ALT: 15 U/L (ref 0–44)
AST: 20 U/L (ref 15–41)
Albumin: 3.9 g/dL (ref 3.5–5.0)
Alkaline Phosphatase: 55 U/L (ref 38–126)
Anion gap: 8 (ref 5–15)
BUN: 14 mg/dL (ref 6–20)
CO2: 23 mmol/L (ref 22–32)
Calcium: 9.1 mg/dL (ref 8.9–10.3)
Chloride: 106 mmol/L (ref 98–111)
Creatinine, Ser: 0.73 mg/dL (ref 0.44–1.00)
GFR, Estimated: >60 mL/min (ref >60)
Glucose, Bld: 115 mg/dL — ABNORMAL HIGH (ref 70–99)
Potassium: 4.1 mmol/L (ref 3.5–5.1)
Sodium: 137 mmol/L (ref 135–145)
Total Bilirubin: 0.4 mg/dL (ref 0.0–1.2)
Total Protein: 7.1 g/dL (ref 6.5–8.1)

## 2023-10-28 LAB — CBC
HCT: 38.3 % (ref 36.0–46.0)
Hemoglobin: 12.7 g/dL (ref 12.0–15.0)
MCH: 30.2 pg (ref 26.0–34.0)
MCHC: 33.2 g/dL (ref 30.0–36.0)
MCV: 91 fL (ref 80.0–100.0)
Platelets: 299 10*3/uL (ref 150–400)
RBC: 4.21 MIL/uL (ref 3.87–5.11)
RDW: 13 % (ref 11.5–15.5)
WBC: 11.9 10*3/uL — ABNORMAL HIGH (ref 4.0–10.5)
nRBC: 0.2 % (ref 0.0–0.2)

## 2023-10-28 LAB — POC URINE PREG, ED: Preg Test, Ur: NEGATIVE

## 2023-10-28 LAB — LIPASE, BLOOD: Lipase: 44 U/L (ref 11–51)

## 2023-10-28 MED ORDER — SODIUM CHLORIDE 0.9 % IV SOLN
1.0000 g | Freq: Once | INTRAVENOUS | Status: AC
  Administered 2023-10-28: 1 g via INTRAVENOUS
  Filled 2023-10-28: qty 10

## 2023-10-28 MED ORDER — CEPHALEXIN 500 MG PO CAPS
500.0000 mg | ORAL_CAPSULE | Freq: Three times a day (TID) | ORAL | 0 refills | Status: AC
Start: 2023-10-28 — End: ?

## 2023-10-28 MED ORDER — KETOROLAC TROMETHAMINE 30 MG/ML IJ SOLN
15.0000 mg | Freq: Once | INTRAMUSCULAR | Status: AC
  Administered 2023-10-28: 15 mg via INTRAVENOUS
  Filled 2023-10-28: qty 1

## 2023-10-28 MED ORDER — SODIUM CHLORIDE 0.9 % IV BOLUS
500.0000 mL | Freq: Once | INTRAVENOUS | Status: AC
  Administered 2023-10-28: 500 mL via INTRAVENOUS

## 2023-10-28 NOTE — ED Triage Notes (Signed)
 Pt to ED via POV c/o lower abd pain x1.5weeks. pain has been getting worse. Pt also endorses pain with urination. Today had blood in urine

## 2023-10-28 NOTE — Discharge Instructions (Signed)
 Take and finish antibiotic as prescribed.  You may take Tylenol  and/or ibuprofen  as needed for pain.  Return to the ER for worsening symptoms, persistent vomiting, fever or other concerns.

## 2023-10-28 NOTE — ED Provider Notes (Signed)
 [facility] Provider Note    Event Date/Time   First MD Initiated Contact with Patient 10/28/23 2302     (approximate)   History   Abdominal Pain   HPI  History obtained via tele-Spanish interpreter  Morgan Chen is a 33 y.o. female who presents to the ED from home with a chief complaint of suprapubic abdominal pain x 1.5 weeks.  Presents tonight because she began to experience hematuria.  Does endorse symptoms of dysuria.  Denies associated fever/chills, chest pain, shortness of breath, flank pain, nausea/vomiting or diarrhea.  No prior history of kidney stones.     Past Medical History   Past Medical History:  Diagnosis Date   Biliary colic 01/2022     Active Problem List  There are no active problems to display for this patient.    Past Surgical History   Past Surgical History:  Procedure Laterality Date   CESAREAN SECTION  11/13/2021   TONSILLECTOMY       Home Medications   Prior to Admission medications   Medication Sig Start Date End Date Taking? Authorizing Provider  cephALEXin  (KEFLEX ) 500 MG capsule Take 1 capsule (500 mg total) by mouth 3 (three) times daily. 10/28/23  Yes Norlene Beavers, MD  HYDROcodone -acetaminophen  (NORCO/VICODIN) 5-325 MG tablet Take 1-2 tablets by mouth every 4 (four) hours as needed for moderate pain. 01/31/22   Pabon, Marcial Setting, MD     Allergies  Patient has no known allergies.   Family History   Family History  Problem Relation Age of Onset   Diabetes Maternal Grandmother    Diabetes Maternal Aunt    Breast cancer Neg Hx      Physical Exam  Triage Vital Signs: ED Triage Vitals  Encounter Vitals Group     BP 10/28/23 2201 118/75     Systolic BP Percentile --      Diastolic BP Percentile --      Pulse Rate 10/28/23 2201 60     Resp 10/28/23 2201 18     Temp 10/28/23 2201 98.3 F (36.8 C)     Temp Source 10/28/23 2201 Oral     SpO2 10/28/23 2201 98 %     Weight 10/28/23 2200 190  lb (86.2 kg)     Height 10/28/23 2200 5' (1.524 m)     Head Circumference --      Peak Flow --      Pain Score 10/28/23 2159 8     Pain Loc --      Pain Education --      Exclude from Growth Chart --     Updated Vital Signs: BP 118/75   Pulse 60   Temp 98.3 F (36.8 C) (Oral)   Resp 18   Ht 5' (1.524 m)   Wt 86.2 kg   LMP 10/13/2023   SpO2 98%   BMI 37.11 kg/m    General: Awake, no distress.  CV:  RRR.  Good peripheral perfusion.  Resp:  Normal effort.  CTAB. Abd:  Mild suprapubic tenderness to palpation without rebound or guarding.  No distention.  No CVAT. Other:  No truncal vesicles.   ED Results / Procedures / Treatments  Labs (all labs ordered are listed, but only abnormal results are displayed) Labs Reviewed  COMPREHENSIVE METABOLIC PANEL WITH GFR - Abnormal; Notable for the following components:      Result Value   Glucose, Bld 115 (*)    All other components within  normal limits  CBC - Abnormal; Notable for the following components:   WBC 11.9 (*)    All other components within normal limits  URINALYSIS, ROUTINE W REFLEX MICROSCOPIC - Abnormal; Notable for the following components:   Color, Urine STRAW (*)    APPearance CLEAR (*)    Hgb urine dipstick MODERATE (*)    Leukocytes,Ua MODERATE (*)    All other components within normal limits  URINE CULTURE  LIPASE, BLOOD  POC URINE PREG, ED     EKG  None   RADIOLOGY None   Official radiology report(s): No results found.   PROCEDURES:  Critical Care performed: No  Procedures   MEDICATIONS ORDERED IN ED: Medications  cefTRIAXone  (ROCEPHIN ) 1 g in sodium chloride  0.9 % 100 mL IVPB (1 g Intravenous New Bag/Given 10/28/23 2332)  sodium chloride  0.9 % bolus 500 mL (500 mLs Intravenous New Bag/Given 10/28/23 2331)  ketorolac  (TORADOL ) 30 MG/ML injection 15 mg (15 mg Intravenous Given 10/28/23 2332)     IMPRESSION / MDM / ASSESSMENT AND PLAN / ED COURSE  I reviewed the triage vital signs and  the nursing notes.                             33 year old female presenting with suprapubic abdominal pain and urinary symptoms. Differential diagnosis includes, but is not limited to, ovarian cyst, ovarian torsion, acute appendicitis, diverticulitis, urinary tract infection/pyelonephritis, endometriosis, bowel obstruction, colitis, renal colic, gastroenteritis, hernia, fibroids, endometriosis, pregnancy related pain including ectopic pregnancy, etc. I personally reviewed patient's records and note a general surgery follow-up visit on 02/14/2021 status postcholecystectomy.  Patient's presentation is most consistent with acute complicated illness / injury requiring diagnostic workup.  Laboratory results demonstrate mild leukocytosis with WBC 11.9, normal renal function, UA demonstrates leukocyte positive UTI with > 50 WBC.  Initiate IV fluid hydration, IV Toradol  for pain, IV Rocephin .  Will discharge home on Keflex  and patient will follow-up closely with her PCP.  Return precautions given.  Patient and spouse verbalized understanding and agree with plan of care.  Of note, I did verify that patient is no longer breast-feeding as it states in her chart.      FINAL CLINICAL IMPRESSION(S) / ED DIAGNOSES   Final diagnoses:  Lower abdominal pain  Lower urinary tract infectious disease  Hematuria, unspecified type     Rx / DC Orders   ED Discharge Orders          Ordered    cephALEXin  (KEFLEX ) 500 MG capsule  3 times daily        10/28/23 2320             Note:  This document was prepared using Dragon voice recognition software and may include unintentional dictation errors.   Larkin Alfred J, MD 10/29/23 0600

## 2023-10-30 LAB — URINE CULTURE

## 2023-12-23 ENCOUNTER — Other Ambulatory Visit: Payer: Self-pay

## 2023-12-23 DIAGNOSIS — N920 Excessive and frequent menstruation with regular cycle: Secondary | ICD-10-CM

## 2024-01-02 ENCOUNTER — Ambulatory Visit
Admission: RE | Admit: 2024-01-02 | Discharge: 2024-01-02 | Disposition: A | Discharge status: Home / Self Care | Source: Ambulatory Visit

## 2024-01-02 DIAGNOSIS — N920 Excessive and frequent menstruation with regular cycle: Secondary | ICD-10-CM | POA: Diagnosis present

## 2024-03-27 ENCOUNTER — Other Ambulatory Visit: Payer: Self-pay

## 2024-03-27 ENCOUNTER — Emergency Department
Admission: EM | Admit: 2024-03-27 | Discharge: 2024-03-27 | Disposition: A | Discharge status: Home / Self Care | Attending: Emergency Medicine | Admitting: Emergency Medicine

## 2024-03-27 ENCOUNTER — Encounter: Payer: Self-pay | Admitting: Emergency Medicine

## 2024-03-27 ENCOUNTER — Emergency Department

## 2024-03-27 DIAGNOSIS — W19XXXA Unspecified fall, initial encounter: Secondary | ICD-10-CM | POA: Diagnosis not present

## 2024-03-27 DIAGNOSIS — S161XXA Strain of muscle, fascia and tendon at neck level, initial encounter: Secondary | ICD-10-CM | POA: Diagnosis not present

## 2024-03-27 DIAGNOSIS — S39012A Strain of muscle, fascia and tendon of lower back, initial encounter: Secondary | ICD-10-CM | POA: Diagnosis not present

## 2024-03-27 DIAGNOSIS — S3992XA Unspecified injury of lower back, initial encounter: Secondary | ICD-10-CM | POA: Diagnosis present

## 2024-03-27 LAB — POC URINE PREG, ED: Preg Test, Ur: NEGATIVE

## 2024-03-27 MED ORDER — NAPROXEN 500 MG PO TBEC: 500.0000 mg | DELAYED_RELEASE_TABLET | Freq: Two times a day (BID) | ORAL | 0 refills | Status: AC

## 2024-03-27 MED ORDER — LIDOCAINE 5 % EX PTCH: 1.0000 | MEDICATED_PATCH | CUTANEOUS | 0 refills | Status: AC

## 2024-03-27 MED ORDER — KETOROLAC TROMETHAMINE 30 MG/ML IJ SOLN
30.0000 mg | Freq: Once | INTRAMUSCULAR | Status: AC
  Administered 2024-03-27: 30 mg via INTRAMUSCULAR
  Filled 2024-03-27: qty 1

## 2024-03-27 MED ORDER — LIDOCAINE 5 % EX PTCH
1.0000 | MEDICATED_PATCH | CUTANEOUS | Status: DC
  Administered 2024-03-27: 1 via TRANSDERMAL
  Filled 2024-03-27: qty 1

## 2024-03-27 MED ORDER — CYCLOBENZAPRINE HCL 10 MG PO TABS
10.0000 mg | ORAL_TABLET | Freq: Once | ORAL | Status: AC
  Administered 2024-03-27: 10 mg via ORAL
  Filled 2024-03-27: qty 1

## 2024-03-27 MED ORDER — CYCLOBENZAPRINE HCL 10 MG PO TABS: 10.0000 mg | ORAL_TABLET | Freq: Three times a day (TID) | ORAL | 0 refills | Status: AC | PRN

## 2024-03-27 NOTE — Discharge Instructions (Signed)
 You have been diagnosed with cervical strain, lumbar strain.  X-ray ruling out fracture.  You can take Flexeril 1 tablet by mouth every 8 hours as needed.  Please avoid driving while taking Flexeril.  If you are planning to drive please take Flexeril only at bedtime.  Please take naproxen every 12 hours after main meals.  You can apply lidocaine  patch every 12 hours.  Please come back to ED or go to your PCP if you have new symptoms or symptoms worsen.  You can call orthopedics to make an appointment for a follow-up

## 2024-03-27 NOTE — ED Provider Notes (Signed)
[facility] Provider Note    Event Date/Time   First MD Initiated Contact with Patient 03/27/24 2055     (approximate)   History   Fall and Back Pain (Radiating down right leg)    HPI  Morgan Chen is a 33 y.o. female    with a past medical history of UTI, cholelithiasis, atypical chest pain,who presents to the ED complaining of fall. According to the patient, yesterday she had a fall landing on her occipital area and lumbar area.  Today patient states having left cervical hip, lumbar and sacral pain.  Lumbar pain radiates to the right lower extremity.  Patient denies loss of consciousness, blurry vision, vomiting. Patient was taking Tylenol  without resolution of the symptoms.  Patient is here with his husband   There are no active problems to display for this patient.  ROS: Patient currently denies any vision changes, tinnitus, difficulty speaking, facial droop, sore throat, chest pain, shortness of breath, abdominal pain, nausea/vomiting/diarrhea, dysuria, or weakness/numbness/paresthesias in any extremity   Physical Exam   Triage Vital Signs: ED Triage Vitals  Encounter Vitals Group     BP 03/27/24 2028 122/80     Girls Systolic BP Percentile --      Girls Diastolic BP Percentile --      Boys Systolic BP Percentile --      Boys Diastolic BP Percentile --      Pulse Rate 03/27/24 2028 73     Resp 03/27/24 2028 17     Temp 03/27/24 2028 98.7 F (37.1 C)     Temp Source 03/27/24 2028 Oral     SpO2 03/27/24 2028 96 %     Weight 03/27/24 2027 204 lb (92.5 kg)     Height 03/27/24 2027 5' 4 (1.626 m)     Head Circumference --      Peak Flow --      Pain Score 03/27/24 2027 8     Pain Loc --      Pain Education --      Exclude from Growth Chart --     Most recent vital signs: Vitals:   03/27/24 2028  BP: 122/80  Pulse: 73  Resp: 17  Temp: 98.7 F (37.1 C)  SpO2: 96%     Physical Exam Vitals and nursing note reviewed.  During  triage vital signs were normal  General:          Awake, no distress.  Glascow 15 or 15, patient is oriented x 4. Head: Tenderness to palpation in the left occipital area. Neck: Tenderness to palpation in the left cervical area and extends to the left shoulder.  Cervical spine: Skin is intact, no ecchymosis or hematomas.  No tenderness to palpation in the spinal process.  There is to palpation and paraspinal muscle CV:                  Good peripheral perfusion.  Resp:               Normal effort. no tachypnea Abd:                 No distention.  Soft nontender Other:              Lumbar spine: Skin is intact, tenderness to palpation in the spinal process of the lumbar and sacral area.  Positive right SLR.  Sensation is intact, strength 5/5. ED Results / Procedures / Treatments   Labs (  all labs ordered are listed, but only abnormal results are displayed) Labs Reviewed  POC URINE PREG, ED - Normal      RADIOLOGY I independently reviewed and interpreted imaging and agree with radiologists findings.     PROCEDURES:  Critical Care performed:   Procedures   MEDICATIONS ORDERED IN ED: Medications  lidocaine  (LIDODERM ) 5 % 1 patch (1 patch Transdermal Patch Applied 03/27/24 2139)  ketorolac  (TORADOL ) 30 MG/ML injection 30 mg (30 mg Intramuscular Given 03/27/24 2140)  cyclobenzaprine (FLEXERIL) tablet 10 mg (10 mg Oral Given 03/27/24 2139)   Clinical Course as of 03/27/24 2242  Sat Mar 27, 2024  2200 POC urine preg, ED Negative [AE]  2230 Updated patient with results of x-ray.  Patient states feeling better after Toradol  and Flexeril.  Patient is ready for discharge.  Patient is agreeable with the plan [AE]    Clinical Course User Index [AE] Janit Kast, PA-C    IMPRESSION / MDM / ASSESSMENT AND PLAN / ED COURSE  I reviewed the triage vital signs and the nursing notes.  Differential diagnosis includes, but is not limited to, cervical strain, fracture, subluxation,  radiculopathy, lumbar strain  Patient's presentation is most consistent with acute complicated illness / injury requiring diagnostic workup.   Morgan Chen is a 33 y.o., female who presents today with history of 24 hours of cervical and lumbar pain after falling yesterday.  On a physical exam tenderness to palpation in the left cervical area, in the left occipital area, tenderness in the spinal process of the lumbar sacral area.  Positive right SLR.  Sensation is intact, no signs of saddle anesthesia. Plan Pregnancy test Lower lumbar and sacral x-ray Flexeril Toradol  Lidocaine  patch Reassess Reassessed the patient after having Toradol , lidocaine  and Flexeril.  Patient feels better.  X-ray ruling out fracture Patient's diagnosis is consistent with cervical sprain, lumbar sprain, congenital L4-L5 fusion. I independently reviewed and interpreted imaging and agree with radiologists findings.  I did not order any labs. I did review the patient's allergies and medications.The patient is in stable and satisfactory condition for discharge home  Patient will be discharged home with prescriptions for Flexeril, naproxen, lidocaine  patch. Patient is to follow up with orthopedics as needed or otherwise directed. Patient is given ED precautions to return to the ED for any worsening or new symptoms. Discussed plan of care with patient, answered all of patient's questions, Patient agreeable to plan of care. Advised patient to take medications according to the instructions on the label. Discussed possible side effects of new medications. Patient verbalized understanding.  FINAL CLINICAL IMPRESSION(S) / ED DIAGNOSES   Final diagnoses:  Strain of lumbar region, initial encounter  Strain of neck muscle, initial encounter     Rx / DC Orders   ED Discharge Orders          Ordered    cyclobenzaprine (FLEXERIL) 10 MG tablet  3 times daily PRN        03/27/24 2240    lidocaine  (LIDODERM ) 5 %  Every 24  hours        03/27/24 2240    naproxen (EC NAPROSYN) 500 MG EC tablet  2 times daily with meals        03/27/24 2240             Note:  This document was prepared using Dragon voice recognition software and may include unintentional dictation errors.   Janit Kast, PA-C 03/27/24 2242    Viviann Pastor, MD  03/28/24 2244  

## 2024-03-27 NOTE — ED Triage Notes (Signed)
 Reports slipped yesterday and bumped head. Felt lightheaded. No blood thinners. Struck back of head off a ramp. Landed on tailbone, pain radiating from mid back to right foot. Reports constant headache.   Denies loss of bladder or bowel function. Ambulating well. Tylenol  at 3p.
